# Patient Record
Sex: Female | Born: 1990 | Race: Black or African American | Hispanic: No | Marital: Single | Smoking: Never smoker
Health system: Southern US, Community
[De-identification: ages and names within clinical notes are randomized; demographics above are authoritative.]

## PROBLEM LIST (undated history)

## (undated) DIAGNOSIS — L309 Dermatitis, unspecified: Secondary | ICD-10-CM

## (undated) HISTORY — DX: Dermatitis, unspecified: L30.9

## (undated) HISTORY — PX: NO PAST SURGERIES: SHX2092

---

## 2012-11-18 ENCOUNTER — Ambulatory Visit: Payer: Self-pay | Admitting: Obstetrics & Gynecology

## 2015-04-15 NOTE — L&D Delivery Note (Signed)
Delivery Note At 04:52 am a viable female, "Elizabeth Sexton", was delivered via Vaginal, Spontaneous Delivery (Presentation:OA restituting to ROA). APGARS: 7, 9; weight pending.   Placenta status: Spontaneous, intact.  Cord: with the following complications: Velamentous, Marginal. Sent to path for analysis. Cord pH: NA  Anesthesia: Local for repair Episiotomy: NA Lacerations: Small 1st degree vaginal - one interrupted placed Suture Repair: 3-0 chromic SH Est. Blood Loss (mL): 350  Mom to postpartum.  Baby to Couplet care / Skin to Skin.  Mom plans to breastfeed.   Undecided re: birth control.   Sherre ScarletKimberly Franko Hilliker, CNM 12/02/15, 5:47 AM

## 2015-05-18 LAB — OB RESULTS CONSOLE RUBELLA ANTIBODY, IGM: Rubella: IMMUNE

## 2015-05-18 LAB — OB RESULTS CONSOLE GC/CHLAMYDIA
CHLAMYDIA, DNA PROBE: POSITIVE
Gonorrhea: NEGATIVE

## 2015-05-18 LAB — OB RESULTS CONSOLE HIV ANTIBODY (ROUTINE TESTING): HIV: NONREACTIVE

## 2015-05-18 LAB — OB RESULTS CONSOLE RPR: RPR: NONREACTIVE

## 2015-05-18 LAB — OB RESULTS CONSOLE ABO/RH: RH TYPE: POSITIVE

## 2015-05-18 LAB — OB RESULTS CONSOLE ANTIBODY SCREEN: ANTIBODY SCREEN: NEGATIVE

## 2015-05-18 LAB — OB RESULTS CONSOLE HEPATITIS B SURFACE ANTIGEN: Hepatitis B Surface Ag: NEGATIVE

## 2015-05-22 DIAGNOSIS — R8271 Bacteriuria: Secondary | ICD-10-CM | POA: Insufficient documentation

## 2015-05-28 DIAGNOSIS — L309 Dermatitis, unspecified: Secondary | ICD-10-CM | POA: Insufficient documentation

## 2015-11-12 LAB — OB RESULTS CONSOLE GBS
STREP GROUP B AG: POSITIVE
STREP GROUP B AG: POSITIVE

## 2015-11-12 LAB — OB RESULTS CONSOLE GC/CHLAMYDIA
Chlamydia: NEGATIVE
Gonorrhea: NEGATIVE

## 2015-12-02 ENCOUNTER — Encounter (HOSPITAL_COMMUNITY): Payer: Self-pay

## 2015-12-02 ENCOUNTER — Inpatient Hospital Stay (HOSPITAL_COMMUNITY)
Admission: AD | Admit: 2015-12-02 | Discharge: 2015-12-04 | DRG: 775 | Disposition: A | Discharge status: Home / Self Care | Payer: Medicaid Other | Source: Ambulatory Visit | Attending: Obstetrics and Gynecology | Admitting: Obstetrics and Gynecology

## 2015-12-02 DIAGNOSIS — Z3A39 39 weeks gestation of pregnancy: Secondary | ICD-10-CM

## 2015-12-02 DIAGNOSIS — O99824 Streptococcus B carrier state complicating childbirth: Secondary | ICD-10-CM | POA: Diagnosis present

## 2015-12-02 DIAGNOSIS — Z833 Family history of diabetes mellitus: Secondary | ICD-10-CM

## 2015-12-02 DIAGNOSIS — O43123 Velamentous insertion of umbilical cord, third trimester: Secondary | ICD-10-CM | POA: Diagnosis present

## 2015-12-02 DIAGNOSIS — Z3403 Encounter for supervision of normal first pregnancy, third trimester: Secondary | ICD-10-CM | POA: Diagnosis present

## 2015-12-02 DIAGNOSIS — O4290 Premature rupture of membranes, unspecified as to length of time between rupture and onset of labor, unspecified weeks of gestation: Secondary | ICD-10-CM | POA: Diagnosis present

## 2015-12-02 LAB — COMPREHENSIVE METABOLIC PANEL
ALBUMIN: 3.4 g/dL — AB (ref 3.5–5.0)
ALK PHOS: 121 U/L (ref 38–126)
ALT: 22 U/L (ref 14–54)
AST: 30 U/L (ref 15–41)
Anion gap: 8 (ref 5–15)
BUN: 7 mg/dL (ref 6–20)
CALCIUM: 9.2 mg/dL (ref 8.9–10.3)
CHLORIDE: 105 mmol/L (ref 101–111)
CO2: 22 mmol/L (ref 22–32)
CREATININE: 0.6 mg/dL (ref 0.44–1.00)
GFR calc non Af Amer: >60 mL/min (ref >60)
GLUCOSE: 78 mg/dL (ref 65–99)
Potassium: 4.1 mmol/L (ref 3.5–5.1)
SODIUM: 135 mmol/L (ref 135–145)
Total Bilirubin: 0.9 mg/dL (ref 0.3–1.2)
Total Protein: 7 g/dL (ref 6.5–8.1)

## 2015-12-02 LAB — CBC
HCT: 33.8 % — ABNORMAL LOW (ref 36.0–46.0)
HEMOGLOBIN: 11.8 g/dL — AB (ref 12.0–15.0)
MCH: 31.8 pg (ref 26.0–34.0)
MCHC: 34.9 g/dL (ref 30.0–36.0)
MCV: 91.1 fL (ref 78.0–100.0)
Platelets: 191 10*3/uL (ref 150–400)
RBC: 3.71 MIL/uL — ABNORMAL LOW (ref 3.87–5.11)
RDW: 13 % (ref 11.5–15.5)
WBC: 13.1 10*3/uL — ABNORMAL HIGH (ref 4.0–10.5)

## 2015-12-02 LAB — TYPE AND SCREEN
ABO/RH(D): O POS
Antibody Screen: NEGATIVE

## 2015-12-02 LAB — LACTATE DEHYDROGENASE: LDH: 183 U/L (ref 98–192)

## 2015-12-02 LAB — URIC ACID: Uric Acid, Serum: 5 mg/dL (ref 2.3–6.6)

## 2015-12-02 LAB — RPR: RPR Ser Ql: NONREACTIVE

## 2015-12-02 LAB — ABO/RH: ABO/RH(D): O POS

## 2015-12-02 MED ORDER — ACETAMINOPHEN 325 MG PO TABS
650.0000 mg | ORAL_TABLET | ORAL | Status: DC | PRN
  Administered 2015-12-02: 650 mg via ORAL
  Filled 2015-12-02: qty 2

## 2015-12-02 MED ORDER — LACTATED RINGERS IV SOLN: 500.0000 mL | Freq: Once | INTRAVENOUS | Status: DC

## 2015-12-02 MED ORDER — DEXTROSE 5 % IV SOLN
2.5000 10*6.[IU] | INTRAVENOUS | Status: DC
  Filled 2015-12-02 (×2): qty 2.5

## 2015-12-02 MED ORDER — PHENYLEPHRINE 40 MCG/ML (10ML) SYRINGE FOR IV PUSH (FOR BLOOD PRESSURE SUPPORT)
80.0000 ug | PREFILLED_SYRINGE | INTRAVENOUS | Status: DC | PRN
  Filled 2015-12-02: qty 5

## 2015-12-02 MED ORDER — FLEET ENEMA 7-19 GM/118ML RE ENEM: 1.0000 | ENEMA | RECTAL | Status: DC | PRN

## 2015-12-02 MED ORDER — OXYTOCIN 40 UNITS IN LACTATED RINGERS INFUSION - SIMPLE MED
2.5000 [IU]/h | INTRAVENOUS | Status: DC
  Filled 2015-12-02: qty 1000

## 2015-12-02 MED ORDER — PENICILLIN G POTASSIUM 5000000 UNITS IJ SOLR
5.0000 10*6.[IU] | Freq: Once | INTRAVENOUS | Status: AC
  Administered 2015-12-02: 5 10*6.[IU] via INTRAVENOUS
  Filled 2015-12-02: qty 5

## 2015-12-02 MED ORDER — METHYLERGONOVINE MALEATE 0.2 MG PO TABS: 0.2000 mg | ORAL_TABLET | ORAL | Status: DC | PRN

## 2015-12-02 MED ORDER — ONDANSETRON HCL 4 MG/2ML IJ SOLN
4.0000 mg | Freq: Four times a day (QID) | INTRAMUSCULAR | Status: DC | PRN
  Administered 2015-12-02: 4 mg via INTRAVENOUS
  Filled 2015-12-02: qty 2

## 2015-12-02 MED ORDER — OXYCODONE-ACETAMINOPHEN 5-325 MG PO TABS: 2.0000 | ORAL_TABLET | ORAL | Status: DC | PRN

## 2015-12-02 MED ORDER — SENNOSIDES-DOCUSATE SODIUM 8.6-50 MG PO TABS
2.0000 | ORAL_TABLET | ORAL | Status: DC
  Administered 2015-12-02 – 2015-12-03 (×2): 2 via ORAL
  Filled 2015-12-02 (×2): qty 2

## 2015-12-02 MED ORDER — SOD CITRATE-CITRIC ACID 500-334 MG/5ML PO SOLN: 30.0000 mL | ORAL | Status: DC | PRN

## 2015-12-02 MED ORDER — OXYCODONE-ACETAMINOPHEN 5-325 MG PO TABS: 1.0000 | ORAL_TABLET | ORAL | Status: DC | PRN

## 2015-12-02 MED ORDER — ACETAMINOPHEN 325 MG PO TABS
650.0000 mg | ORAL_TABLET | ORAL | Status: DC | PRN
Start: 2015-12-02 — End: 2015-12-02

## 2015-12-02 MED ORDER — BENZOCAINE-MENTHOL 20-0.5 % EX AERO: 1.0000 "application " | INHALATION_SPRAY | CUTANEOUS | Status: DC | PRN

## 2015-12-02 MED ORDER — WITCH HAZEL-GLYCERIN EX PADS: 1.0000 "application " | MEDICATED_PAD | CUTANEOUS | Status: DC | PRN

## 2015-12-02 MED ORDER — LACTATED RINGERS IV SOLN
INTRAVENOUS | Status: DC
  Administered 2015-12-02 (×2): via INTRAVENOUS

## 2015-12-02 MED ORDER — PRENATAL MULTIVITAMIN CH
1.0000 | ORAL_TABLET | Freq: Every day | ORAL | Status: DC
  Administered 2015-12-02 – 2015-12-04 (×3): 1 via ORAL
  Filled 2015-12-02 (×3): qty 1

## 2015-12-02 MED ORDER — FENTANYL 2.5 MCG/ML BUPIVACAINE 1/10 % EPIDURAL INFUSION (WH - ANES): 14.0000 mL/h | INTRAMUSCULAR | Status: DC | PRN

## 2015-12-02 MED ORDER — OXYTOCIN BOLUS FROM INFUSION
500.0000 mL | Freq: Once | INTRAVENOUS | Status: AC
  Administered 2015-12-02: 500 mL via INTRAVENOUS

## 2015-12-02 MED ORDER — COCONUT OIL OIL: 1.0000 "application " | TOPICAL_OIL | Status: DC | PRN

## 2015-12-02 MED ORDER — FERROUS SULFATE 325 (65 FE) MG PO TABS
325.0000 mg | ORAL_TABLET | Freq: Two times a day (BID) | ORAL | Status: DC
  Administered 2015-12-02 – 2015-12-04 (×5): 325 mg via ORAL
  Filled 2015-12-02 (×5): qty 1

## 2015-12-02 MED ORDER — LIDOCAINE HCL (PF) 1 % IJ SOLN
30.0000 mL | INTRAMUSCULAR | Status: AC | PRN
  Administered 2015-12-02: 30 mL via SUBCUTANEOUS
  Filled 2015-12-02: qty 30

## 2015-12-02 MED ORDER — SIMETHICONE 80 MG PO CHEW
80.0000 mg | CHEWABLE_TABLET | ORAL | Status: DC | PRN
Start: 2015-12-02 — End: 2015-12-04

## 2015-12-02 MED ORDER — IBUPROFEN 600 MG PO TABS
600.0000 mg | ORAL_TABLET | Freq: Four times a day (QID) | ORAL | Status: DC
  Administered 2015-12-02 – 2015-12-04 (×9): 600 mg via ORAL
  Filled 2015-12-02 (×9): qty 1

## 2015-12-02 MED ORDER — METHYLERGONOVINE MALEATE 0.2 MG/ML IJ SOLN: 0.2000 mg | INTRAMUSCULAR | Status: DC | PRN

## 2015-12-02 MED ORDER — ONDANSETRON HCL 4 MG/2ML IJ SOLN: 4.0000 mg | INTRAMUSCULAR | Status: DC | PRN

## 2015-12-02 MED ORDER — ZOLPIDEM TARTRATE 5 MG PO TABS: 5.0000 mg | ORAL_TABLET | Freq: Every evening | ORAL | Status: DC | PRN

## 2015-12-02 MED ORDER — ONDANSETRON HCL 4 MG PO TABS: 4.0000 mg | ORAL_TABLET | ORAL | Status: DC | PRN

## 2015-12-02 MED ORDER — DIBUCAINE 1 % RE OINT: 1.0000 "application " | TOPICAL_OINTMENT | RECTAL | Status: DC | PRN

## 2015-12-02 MED ORDER — DIPHENHYDRAMINE HCL 50 MG/ML IJ SOLN: 12.5000 mg | INTRAMUSCULAR | Status: DC | PRN

## 2015-12-02 MED ORDER — LACTATED RINGERS IV SOLN: 500.0000 mL | INTRAVENOUS | Status: DC | PRN

## 2015-12-02 MED ORDER — DIPHENHYDRAMINE HCL 25 MG PO CAPS: 25.0000 mg | ORAL_CAPSULE | Freq: Four times a day (QID) | ORAL | Status: DC | PRN

## 2015-12-02 MED ORDER — TETANUS-DIPHTH-ACELL PERTUSSIS 5-2.5-18.5 LF-MCG/0.5 IM SUSP: 0.5000 mL | Freq: Once | INTRAMUSCULAR | Status: DC

## 2015-12-02 MED ORDER — EPHEDRINE 5 MG/ML INJ
10.0000 mg | INTRAVENOUS | Status: DC | PRN
  Filled 2015-12-02: qty 4

## 2015-12-02 MED ORDER — NALBUPHINE HCL 10 MG/ML IJ SOLN: 10.0000 mg | INTRAMUSCULAR | Status: DC | PRN

## 2015-12-02 MED ORDER — HYDROXYZINE HCL 50 MG PO TABS
50.0000 mg | ORAL_TABLET | Freq: Four times a day (QID) | ORAL | Status: DC | PRN
  Filled 2015-12-02: qty 1

## 2015-12-02 NOTE — MAU Note (Signed)
Pt c/o contractions since 7-8pm every . Denies LOF or vag bleeding. +FM. Was 2cm at last exam.

## 2015-12-02 NOTE — H&P (Signed)
Elizabeth Sexton is a 25 y.o. female, G1P0 at 39.0 wks (as dated by 18.3 wk us), presents w/ c/o ctxs q 6 min since 7-8 pm last evening. Reports ROM (mec stained at 02:00) after vaginal exam by RN. Denies VB. +FM. Was 2cm at last exam. Wanted WB, but not a candidate due to meconium stained fluid.  Pt entered care with [facility] at 12.2 wks.  Prenatal course significant for:  1) Chlamydia this pregnancy - Treated; TOC neg. 2) GBS positive. 3) Short cervical length - was using daily vaginal progesterone and getting weekly cervical lengths. 4) Velamentous & marginal cord insertion - was getting serial ultrasounds starting at 28 wks. 5) H/O eczema.   Ultrasounds: Normal scans. Growth on 11/12/15 @ 36.1 wks = 5 lbs 14 oz; 2653 g (+/-387 g); anterior placenta.  OB History    Gravida Para Term Preterm AB Living   1         0   SAB TAB Ectopic Multiple Live Births                 Past Medical History:  Diagnosis Date  . Medical history non-contributory    Past Surgical History:  Procedure Laterality Date  . NO PAST SURGERIES     Family History: family history includes Diabetes in her maternal grandmother. Social History:  reports that she has never smoked. She has never used smokeless tobacco. She reports that she does not drink alcohol or use drugs.     Maternal Diabetes: No Genetic Screening: Normal Maternal Ultrasounds/Referrals: Normal Fetal Ultrasounds or other Referrals:  None Maternal Substance Abuse:  No Significant Maternal Medications:  PNV, Prometrium, Hydrocortisone cream Significant Maternal Lab Results:  Lab values include: Group B Strep positive Other Comments:  Declined Tdap  ROS 10 Systems reviewed and are negative for acute change except as noted in the HPI. History Exam  Dilation: 3 Effacement (%): 100 Station: -1 Exam by:: [email or web address],rnc at 02:05 Cephalic by Leopold's EFW: 7 1/4 lbs; adequate for trial of labor  Physical Exam   Nursing note and vitals  reviewed. Neurological: She has normal reflexes.  Constitutional: She is oriented to person, place, and time. She appears well-developed and well-nourished.  HENT:  Head: Normocephalic and atraumatic.  Neck: Normal range of motion.  Cardiovascular: Normal rate, regular rhythm and normal heart sounds.  Respiratory: Effort normal and breath sounds normal.  GI: Soft. Bowel sounds are normal. Abdomen is gravid.  Neurological: She is alert and oriented to person, place, and time.  Skin: Skin is warm and dry.  Musculoskeletal: Exhibits 1+ lower extremity edema. Psychiatric: She has a normal mood and affect. Her behavior is normal.   Results for orders placed or performed during the hospital encounter of 12/02/15 (from the past 24 hour(s))  CBC     Status: Abnormal   Collection Time: 12/02/15  2:30 AM  Result Value Ref Range   WBC 13.1 (H) 4.0 - 10.5 K/uL   RBC 3.71 (L) 3.87 - 5.11 MIL/uL   Hemoglobin 11.8 (L) 12.0 - 15.0 g/dL   HCT 40.933.8 (L) 81.136.0 - 91.446.0 %   MCV 91.1 78.0 - 100.0 fL   MCH 31.8 26.0 - 34.0 pg   MCHC 34.9 30.0 - 36.0 g/dL   RDW 78.213.0 95.611.5 - 21.315.5 %   Platelets 191 150 - 400 K/uL  Type and screen [facility]     Status: None   Collection Time: 12/02/15  2:30 AM  Result Value Ref  Range   ABO/RH(D) O POS    Antibody Screen NEG    Sample Expiration 12/05/2015   Comprehensive metabolic panel     Status: Abnormal   Collection Time: 12/02/15  2:30 AM  Result Value Ref Range   Sodium 135 135 - 145 mmol/L   Potassium 4.1 3.5 - 5.1 mmol/L   Chloride 105 101 - 111 mmol/L   CO2 22 22 - 32 mmol/L   Glucose, Bld 78 65 - 99 mg/dL   BUN 7 6 - 20 mg/dL   Creatinine, Ser 0.860.60 0.44 - 1.00 mg/dL   Calcium 9.2 8.9 - 57.810.3 mg/dL   Total Protein 7.0 6.5 - 8.1 g/dL   Albumin 3.4 (L) 3.5 - 5.0 g/dL   AST 30 15 - 41 U/L   ALT 22 14 - 54 U/L   Alkaline Phosphatase 121 38 - 126 U/L   Total Bilirubin 0.9 0.3 - 1.2 mg/dL   GFR calc non Af Amer >60 >60 mL/min   GFR calc  Af Amer >60 >60 mL/min   Anion gap 8 5 - 15  Lactate dehydrogenase     Status: None   Collection Time: 12/02/15  2:30 AM  Result Value Ref Range   LDH 183 98 - 192 U/L  Uric acid     Status: None   Collection Time: 12/02/15  2:30 AM  Result Value Ref Range   Uric Acid, Serum 5.0 2.3 - 6.6 mg/dL  ABO/Rh     Status: None   Collection Time: 12/02/15  2:30 AM  Result Value Ref Range   ABO/RH(D) O POS     Prenatal labs: ABO, Rh: O/Positive/-- (02/03 0000) Antibody: Negative (02/03 0000) Rubella: Immune (02/03 0000) RPR: Nonreactive (02/03 0000)  HBsAg: Negative (02/03 0000)  HIV: Non-reactive (02/03 0000)  GBS: Positive, Positive (07/31 0000)  Hbg: 10.7 @ 28 wks  Assessment: IUP at 39.0 wks Latent phase labor SROM, mod mec x 1 1/2 hrs; no s/s of infection; not a WB candidate FWB: Overall reassuring GBS positive  [facility] Routine [facility] orders.  Expectant management for now. PCN for GBS positive status. Pain med/epidural prn. Expect SVD.   Sherre ScarletWILLIAMS, Draxton Luu 12/02/2015, 2:13 AM

## 2015-12-02 NOTE — Lactation Note (Addendum)
This note was copied from a baby's chart. Lactation Consultation Note  Patient Name: Elizabeth Sexton ZOXWR'UToday's Date: 12/02/2015 Reason for consult: Initial assessment;Other (Comment);Infant < 6lbs (term , < 6 pounds, )  Baby is 9 hours old and has been to the breast x 4 for 15,10, 25, 10 mins. - Latch sore 8-9 .  1 void, 1 stool. Per mom last fed at 1340 for 10 mins. Presently baby in crib asleep.  LC reviewed potential feeding patterns of a term , < 6 pound infant , importance of skin to skin feedings.  And to offer the breast at least every 3 hours .  [facility] showed me hand expressing.  LC encouraged mom to call on the nurses light for a feeding assessment.  Mother informed of post-discharge support and given phone number to the lactation department, including services  for phone call assistance; out-patient appointments; and breastfeeding support group. List of other breastfeeding resources  in the community given in the handout. Encouraged mother to call for problems or concerns related to breastfeeding. LC discussed due to the baby being small sometimes ealy breast feeding post pumping both breast with a DEBP is added if needed if the baby becomes sluggish with feedings and hand expressing.    Maternal Data Has patient been taught Hand Expression?: Yes (per mom [facility] showed mom earlier ) Does the patient have breastfeeding experience prior to this delivery?: No  Feeding Feeding Type:  (per mom baby last fed at 1340 for 10 mins ) Length of feed: 10 min (per mom )  LATCH Score/Interventions ( earlier latch score by [facility] RN )  Latch: Grasps breast easily, tongue down, lips flanged, rhythmical sucking.  Audible Swallowing: Spontaneous and intermittent  Type of Nipple: Everted at rest and after stimulation  Comfort (Breast/Nipple): Soft / non-tender     Hold (Positioning): Assistance needed to correctly position infant at breast and maintain latch. Intervention(s):  Breastfeeding basics reviewed  LATCH Score: 9  Lactation Tools Discussed/Used WIC Program: Yes (per mom [place] )   Consult Status Consult Status: Follow-up Date: 12/02/15 Follow-up type: In-patient    Kathrin Greathouseorio, Emad Brechtel Ann 12/02/2015, 2:45 PM

## 2015-12-03 LAB — CBC
HCT: 29.9 % — ABNORMAL LOW (ref 36.0–46.0)
HEMOGLOBIN: 10 g/dL — AB (ref 12.0–15.0)
MCH: 30.9 pg (ref 26.0–34.0)
MCHC: 33.4 g/dL (ref 30.0–36.0)
MCV: 92.3 fL (ref 78.0–100.0)
PLATELETS: 187 10*3/uL (ref 150–400)
RBC: 3.24 MIL/uL — AB (ref 3.87–5.11)
RDW: 13 % (ref 11.5–15.5)
WBC: 13 10*3/uL — AB (ref 4.0–10.5)

## 2015-12-03 NOTE — Discharge Summary (Signed)
[facility]   Patient Name:   Elizabeth Sexton DOB:     02/23/1991 MRN:     [id number]  Date of Admission:   12/02/2015 Date of Discharge:  12/04/2015  Admitting diagnosis:    in labor, contractions, seeing mucus Principal Problem:   Vaginal delivery Active Problems:   First degree perineal laceration during delivery      Discharge diagnosis:    in labor, contractions, seeing mucus Principal Problem:   Vaginal delivery Active Problems:   First degree perineal laceration during delivery                                                                  Post partum procedures: None  Type of Delivery:  SVB  Delivering Provider: Sherre ScarletWILLIAMS, KIMBERLY   Date of Delivery:  12/02/15  Newborn Data:    Live born female  Birth Weight: 5 lb 12.4 oz (2620 g) APGAR: 7, 9  Baby's Name:  Elizabeth Sexton Baby Feeding:   Breast Disposition:   home with mother  Complications:   None  Hospital course:      Onset of Labor With Vaginal Delivery     25 y.o. yo G1P1001 at 5059w0d was admitted in Latent Labor on 12/02/2015. Patient had an uncomplicated labor course as follows:  Membrane Rupture Time/Date: 2:00 AM ,12/02/2015   Intrapartum Procedures: Episiotomy: None [1]                                         Lacerations:  1st degree [2];Vaginal [6]  Patient had a delivery of a Viable infant. 12/02/2015  Information for the patient's newborn:  Elizabeth Sexton, Girl Elizabeth Sexton [[id number]][[id number]]  Delivery Method: Vaginal, Spontaneous Delivery (Filed from Delivery Summary)    Pateint had an uncomplicated postpartum course.  She is ambulating, tolerating a regular diet, passing flatus, and urinating well. Patient is discharged home in stable condition on 12/04/15.    Physical Exam: Vitals:   12/03/15 1758 12/04/15 0555  BP: 124/62 130/67  Pulse: 82 86  Resp: 18 18  Temp: 98.3 F (36.8 C) 98.2 F (36.8 C)    General: alert Lochia: appropriate Uterine Fundus: firm Incision:  Healing well with no significant drainage DVT Evaluation: No evidence of DVT seen on physical exam. Negative Homan's sign.  Labs:  CBC Latest Ref Rng & Units 12/03/2015 12/02/2015  WBC 4.0 - 10.5 K/uL 13.0(H) 13.1(H)  Hemoglobin 12.0 - 15.0 g/dL 10.0(L) 11.8(L)  Hematocrit 36.0 - 46.0 % 29.9(L) 33.8(L)  Platelets 150 - 400 K/uL 187 191     Discharge instruction: per After Visit Summary and "Baby and Me Booklet".  After Visit Meds:    Medication List    TAKE these medications   ibuprofen 600 MG tablet Commonly known as:  ADVIL,MOTRIN Take 1 tablet (600 mg total) by mouth every 6 (six) hours as needed.   OVER THE COUNTER MEDICATION Take 1 tablet by mouth daily.   VITAFOL GUMMIES 3.33-0.333-34.8 MG Chew CHEW 3 GUMMIES PO QD AS DIRECTED       Diet: routine diet  Activity: Advance as tolerated. Pelvic rest for 6 weeks.   Outpatient follow up:6 weeks  Follow up Appt:No future appointments. Follow up visit: No Follow-up on file.  Postpartum contraception: Undecided  12/04/2015 Nigel BridgemanLATHAM, Decarlos Empey, CNM

## 2015-12-03 NOTE — Lactation Note (Signed)
This note was copied from a baby's chart. Lactation Consultation Note  Patient Name: Elizabeth Sexton Reason for consult: Follow-up assessment;Infant < 6lbs;Other (Comment) (< 6 pounds, 4% weight loss, term ) Baby is 33 hours of age and has been consistent at the breast . @ the start of the consult baby latched , mom independent  And per mom 2nd breast at this feeding , depth noted, swallows, increased with breast compressions.  After 15 mins baby sleepy and non - nutritive so mom released latch. Nipple normal shape.  Baby still rooting and LC offered to re-latch on the other breast and per mom she already fed over there.  LC reviewed what cluster feeding looks like and that it can be different for all babies , different times a day , evening or night. Since the weight loss is only 4 % weight loss , LC felt mom could keep it simple , feed by 3 hours around the clock and could add post pumping  With a DEBP if the weight loss increases. LC explained reasons for doing that.    Maternal Data    Feeding Feeding Type:  (baby already latched with depth , swallows noted ) Length of feed: 15 min (per mom start time,  swallows , increased w/ breast compresi)  LATCH Score/Interventions Latch: Grasps breast easily, tongue down, lips flanged, rhythmical sucking. Intervention(s): Breast compression;Breast massage  Audible Swallowing: A few with stimulation  Type of Nipple: Everted at rest and after stimulation  Comfort (Breast/Nipple): Soft / non-tender     Hold (Positioning): No assistance needed to correctly position infant at breast. Intervention(s): Breastfeeding basics reviewed;Support Pillows;Position options;Skin to skin  LATCH Score: 9  Lactation Tools Discussed/Used     Consult Status Consult Status: Follow-up Date: 12/04/15 Follow-up type: In-patient    Kathrin Greathouseorio, Edmar Blankenburg [name], 2:31 PM

## 2015-12-03 NOTE — Progress Notes (Signed)
Elizabeth Sexton, Elizabeth Sexton Female, 25 y.o., Oct 30, 1990  Post Partum Day 1   Subjective: no complaints  Objective: Blood pressure 130/77, pulse 68, temperature 97.9 F (36.6 C), resp. rate 19, height 5\' 7"  (1.702 m), weight 81.2 kg (179 lb), SpO2 100 %, unknown if currently breastfeeding.  Physical Exam:  General: alert, cooperative and no distress Lochia: appropriate Uterine Fundus: firm DVT Evaluation: No evidence of DVT seen on physical exam. Calf/Ankle edema is present. 1+ edema bilaterally in legs.     Recent Labs  12/02/15 0230 12/03/15 0557  HGB 11.8* 10.0*  HCT 33.8* 29.9*    Assessment/Plan: Plan for discharge tomorrow, Breastfeeding and Contraception is undecided, all options discussed, she will think more about it.    LOS: 1 day   [facility] Hospital[facility],Elizabeth Sexton 12/03/2015, 1:49 PM

## 2015-12-03 NOTE — Progress Notes (Signed)
UR chart review completed.  

## 2015-12-03 NOTE — Discharge Instructions (Signed)

## 2015-12-04 MED ORDER — IBUPROFEN 600 MG PO TABS: 600.0000 mg | ORAL_TABLET | Freq: Four times a day (QID) | ORAL | 2 refills | Status: DC | PRN

## 2015-12-04 NOTE — Lactation Note (Signed)
This note was copied from a baby's chart. Lactation Consultation Note  Patient Name: Elizabeth Micki RileyKiera Besancon JYNWG'NToday's Date: 12/04/2015 Reason for consult: Follow-up assessment;Infant < 6lbs Follow up visit made prior to discharge.  Mom is feeling like feedings are going well.  Breasts are filling.  Instructed to use good breast massage during feeding,  Feed on cue and keep a feeding diary the first week home.  Engorgement treatment instructions given.  Lactation outpatient services and support information reviewed and encouraged prn.   Maternal Data    Feeding    LATCH Score/Interventions                      Lactation Tools Discussed/Used     Consult Status Consult Status: Complete    Shondell Poulson S 12/04/2015, 10:00 AM

## 2016-12-11 ENCOUNTER — Ambulatory Visit (INDEPENDENT_AMBULATORY_CARE_PROVIDER_SITE_OTHER): Payer: 59 | Admitting: Allergy

## 2016-12-11 ENCOUNTER — Encounter: Payer: Self-pay | Admitting: Allergy

## 2016-12-11 VITALS — BP 116/78 | HR 100 | Temp 98.6°F | Resp 18 | Ht 67.3 in | Wt 142.0 lb

## 2016-12-11 DIAGNOSIS — L309 Dermatitis, unspecified: Secondary | ICD-10-CM | POA: Diagnosis not present

## 2016-12-11 MED ORDER — CLOBETASOL PROPIONATE 0.05 % EX OINT: TOPICAL_OINTMENT | CUTANEOUS | 5 refills | Status: DC

## 2016-12-11 NOTE — Patient Instructions (Signed)
Hand Dermatitis     - appearance of rash looks consistent with eczema of the hands.  There also may be a contact dermatitis component that can be ruled out with patch testing.  Recommend patch test placement on a Monday or Tuesday (this can be done in [place] office).  You would return 2 days after placement in for initial reading and again 2 days later for final reading.       - will prescribe clobetasol to apply twice a day to affected areas.      - apply your moisturizer on top-- best moisturizers are thick and greasy like Vaseline, Aquafor, Eucerin, CeraVe, Aveeno eczema.       - recommend sleeping in cotton gloves to help maintain moisture to the hands.       - Dupixent discussed today as potential treatment option if patch testing is negative and doesn't reveal a contact dermatitis.     Follow-up for patch test placement and then routinely in 4-6 months

## 2016-12-11 NOTE — Progress Notes (Signed)
New Patient Note  RE: Elizabeth Sexton MRN: [id number] DOB: 10/19/1990 Date of Office Visit: 12/11/2016  Referring provider: No ref. provider found Primary care provider: Patient, No Pcp Per  Chief Complaint: Rash on hands  History of present illness: Elizabeth Sexton is a 26 y.o. female presenting today for evaluation of hand dermatitis.  She has had a rash on her hands since March.  She does have a history of eczema but reports she hadn't had eczema in years until she became pregnant.  After delivery about a year ago it resolved. She states that this rash looks somewhat different than her typical eczema flares.  The rash in her hands is very itchy and she reports appearance today is decent. She has had issues across her knuckles of both hands and the rash does extend onto the palmar surface and proximally onto her wrists.  She denies the rash and blistering or sloughing and has not noticed any significant scaling or flaking. The rash has been very dry and rough.  She works from home and is not involved in any chemicals or other harsh products. She stopped wearing jewelry.  She does not know what triggered this rash.  She denies having any fevers or secondary infections.  She has tried hydrocortisone, aloe, lanolin, breast milk, A&D ointment, lotions all that have not been very helpful.   She lotions with Aveeno.  Rash is itchy, burns, and is dry.   She currently is breast-feeding.    She denies any history of asthma, food allergy or environmental allergy  Review of systems: Review of Systems  Constitutional: Negative for chills, fever and malaise/fatigue.  HENT: Negative for congestion, ear discharge, ear pain, nosebleeds, sinus pain, sore throat and tinnitus.   Eyes: Negative for discharge and redness.  Respiratory: Negative for cough, shortness of breath and wheezing.   Cardiovascular: Negative for chest pain.  Gastrointestinal: Negative for abdominal pain, constipation, diarrhea,  heartburn, nausea and vomiting.  Musculoskeletal: Negative for joint pain and myalgias.  Skin: Positive for itching and rash.  Neurological: Negative for headaches.    All other systems negative unless noted above in HPI  Past medical history: Past Medical History:  Diagnosis Date  . Eczema     Past surgical history: Past Surgical History:  Procedure Laterality Date  . NO PAST SURGERIES      Family history:  Family History  Problem Relation Age of Onset  . Diabetes Maternal Grandmother     Social history: She lives in a home without carpeting with gas and electric heating and central cooling. There are no pets in the home. There is no concern for water damage, mildew or roaches in the home. She works from home. She has no smoking history.   Medication List: Allergies as of 12/11/2016   No Known Allergies     Medication List       Accurate as of 12/11/16  1:34 PM. Always use your most recent med list.          clobetasol ointment 0.05 % Commonly known as:  TEMOVATE Apply to affected areas twice daily as directed   VITAFOL GUMMIES 3.33-0.333-34.8 MG Chew CHEW 3 GUMMIES PO QD AS DIRECTED            Discharge Care Instructions        Start     Ordered   12/11/16 0000  clobetasol ointment (TEMOVATE) 0.05 %     12/11/16 1151      Known medication  allergies: No Known Allergies   Physical examination: Blood pressure 116/78, pulse 100, temperature 98.6 F (37 C), temperature source Oral, resp. rate 18, height 5' 7.3" (1.709 m), weight 142 lb (64.4 kg), unknown if currently breastfeeding.  General: Alert, interactive, in no acute distress. HEENT: PERRLA, TMs pearly gray, turbinates non-edematous without discharge, post-pharynx non erythematous. Neck: Supple without lymphadenopathy. Lungs: Clear to auscultation without wheezing, rhonchi or rales. {no increased work of breathing. CV: Normal S1, S2 without murmurs. Abdomen: Nondistended,  nontender. Skin: Dry, erythematous, excoriated patches on the Hands across knuckles on the ventral surface extending onto the palmar surface involving the fingers.  There is also some mild hyperpigmentation.  Rash does extend to the wrists. Extremities:  No clubbing, cyanosis or edema. Neuro:   Grossly intact.  Diagnositics/Labs: None today  Assessment and plan:   Hand Dermatitis     - appearance of rash looks consistent with Atopic dermatitis.  However this also could be related to a contact dermatitis that can be ruled out with patch testing.  Recommend patch test placement on a Monday or Tuesday (this can be done in [place] office).  You would return 2 days after placement in for initial reading and again 2 days later for final reading.       - will prescribe clobetasol to apply twice a day to affected areas.      - apply your moisturizer on top-- best moisturizers are thick and greasy like Vaseline, Aquafor, Eucerin, CeraVe, Aveeno eczema.       - recommend sleeping in cotton gloves to help maintain moisture to the hands.       - Dupixent discussed today as potential treatment option if patch testing is negative and doesn't reveal a contact dermatitis.     Follow-up for patch test placement and then routinely in 4-6 months  I appreciate the opportunity to take part in Marika's care. Please do not hesitate to contact me with questions.  Sincerely,   Margo Aye, MD Allergy/Immunology [facility]

## 2017-01-06 ENCOUNTER — Encounter: Payer: Self-pay | Admitting: Allergy and Immunology

## 2017-01-06 ENCOUNTER — Ambulatory Visit (INDEPENDENT_AMBULATORY_CARE_PROVIDER_SITE_OTHER): Payer: 59 | Admitting: Allergy and Immunology

## 2017-01-06 VITALS — BP 114/70 | HR 97 | Temp 98.4°F | Resp 18

## 2017-01-06 DIAGNOSIS — L309 Dermatitis, unspecified: Secondary | ICD-10-CM

## 2017-01-06 NOTE — Progress Notes (Signed)
Elizabeth Sexton presents to this clinic to have patch testing performed. She had the True Test patch test applied to her back and she will return to this clinic in 48-72 hours for initial read.

## 2017-01-08 ENCOUNTER — Encounter: Payer: Self-pay | Admitting: Allergy

## 2017-01-08 ENCOUNTER — Ambulatory Visit: Payer: 59 | Admitting: Allergy

## 2017-01-08 DIAGNOSIS — L309 Dermatitis, unspecified: Secondary | ICD-10-CM

## 2017-01-08 NOTE — Progress Notes (Addendum)
  Elizabeth Sexton returns to the office today for the final patch test interpretation, given suspected history of contact dermatitis.    Diagnostics:   TRUE TEST 48-hour hour reading: weak positive reaction to #4 (Potassium dichromate), weak positive  reaction to #17 (Cl+Me-Isothiazolinone), strong positive reaction to #23 (Thiomersal), weak positive reaction to #28 (Gold sodium thiosulfate) and weak positive reaction to #33 (Bacitracin)  Plan:   Allergic contact dermatitis - The patient has been provided detailed information regarding the substances she is sensitive to, as well as products containing the substances.   - Meticulous avoidance of these substances is recommended.  - If avoidance is not possible, the use of barrier creams or lotions is recommended. - If symptoms persist or progress despite meticulous avoidance of the positive substances, Dermatology Referral may be warranted.  Attestation: I interpreted patch testing results along with NP Brynnly Bonet and discussed management with the resident. I reviewed the NP's note and agree with the documented findings and plan of care.   Margo Aye, MD [facility]

## 2017-01-12 ENCOUNTER — Encounter (INDEPENDENT_AMBULATORY_CARE_PROVIDER_SITE_OTHER): Payer: Self-pay

## 2017-01-12 ENCOUNTER — Ambulatory Visit: Payer: 59 | Admitting: Allergy and Immunology

## 2017-01-12 DIAGNOSIS — L253 Unspecified contact dermatitis due to other chemical products: Secondary | ICD-10-CM | POA: Insufficient documentation

## 2017-01-12 NOTE — Addendum Note (Signed)
Addended by: Clifton James on: 01/12/2017 04:59 PM   Modules accepted: Orders

## 2017-01-12 NOTE — Progress Notes (Signed)
    Follow-up Note  RE: Elizabeth Sexton MRN: [id number] DOB: November 15, 1990 Date of Office Visit: 01/12/2017  Primary care provider: Patient, No Pcp Per Referring provider: No ref. provider found   [name] returns to the office today for the final patch test interpretation, given suspected history of contact dermatitis.    Diagnostics:  TRUE TEST final reading: Positive to thimerosal.  Assessment and Plan:   Allergic contact dermatitis  The patient has been provided detailed information regarding thimerosal, as well as products containing this substance.  Meticulous avoidance of thimerosal is recommended. If avoidance is not possible, the use of barrier creams or lotions is recommended.  Follow up with Dr. Delorse Lek as directed.

## 2017-01-12 NOTE — Patient Instructions (Signed)
Contact dermatitis due to chemicals  The patient has been provided detailed information regarding thimerosal, as well as products containing this substance.  Meticulous avoidance of thimerosal is recommended. If avoidance is not possible, the use of barrier creams or lotions is recommended.  Follow up with Dr. Delorse Lek as directed.   Return if symptoms worsen or fail to improve.

## 2017-01-12 NOTE — Assessment & Plan Note (Signed)
   The patient has been provided detailed information regarding thimerosal, as well as products containing this substance.  Meticulous avoidance of thimerosal is recommended. If avoidance is not possible, the use of barrier creams or lotions is recommended.  Follow up with Dr. Delorse Lek as directed.

## 2017-01-19 ENCOUNTER — Ambulatory Visit: Payer: PRIVATE HEALTH INSURANCE | Admitting: Allergy and Immunology

## 2019-08-04 ENCOUNTER — Ambulatory Visit: Payer: Self-pay | Attending: Internal Medicine

## 2019-08-04 DIAGNOSIS — Z23 Encounter for immunization: Secondary | ICD-10-CM

## 2019-08-04 NOTE — Progress Notes (Signed)
   Covid-19 Vaccination Clinic  Name:  Elizabeth Sexton    MRN: [id number] DOB: 14-Dec-1990  08/04/2019  [name] was observed post Covid-19 immunization for 15 minutes without incident. She was provided with Vaccine Information Sheet and instruction to access the V-Safe system.   [name] was instructed to call 911 with any severe reactions post vaccine: Marland Kitchen Difficulty breathing  . Swelling of face and throat  . A fast heartbeat  . A bad rash all over body  . Dizziness and weakness   Immunizations Administered    Name Date Dose VIS Date Route   Pfizer COVID-19 Vaccine 08/04/2019  2:58 PM 0.3 mL 06/08/2018 Intramuscular   Manufacturer: [employer], [employer]   Lot: [id number]   NDC: [address]-3

## 2019-08-29 ENCOUNTER — Ambulatory Visit: Payer: Self-pay

## 2019-09-01 ENCOUNTER — Ambulatory Visit: Payer: Medicaid Other | Attending: Internal Medicine

## 2019-09-01 DIAGNOSIS — Z23 Encounter for immunization: Secondary | ICD-10-CM

## 2019-09-01 NOTE — Progress Notes (Signed)
   Covid-19 Vaccination Clinic  Name:  Ayeisha Lindenberger    MRN: [id number] DOB: 1990-10-29  09/01/2019  Ms. Molstad was observed post Covid-19 immunization for 15 minutes without incident. She was provided with Vaccine Information Sheet and instruction to access the V-Safe system.   Ms. Schewe was instructed to call 911 with any severe reactions post vaccine: Marland Kitchen Difficulty breathing  . Swelling of face and throat  . A fast heartbeat  . A bad rash all over body  . Dizziness and weakness   Immunizations Administered    Name Date Dose VIS Date Route   Pfizer COVID-19 Vaccine 09/01/2019 12:19 PM 0.3 mL 06/08/2018 Intramuscular   Manufacturer: [employer], [employer]   Lot: [id number]   NDC: [address]-9

## 2019-10-21 ENCOUNTER — Other Ambulatory Visit: Payer: Self-pay | Admitting: Obstetrics and Gynecology

## 2019-10-21 DIAGNOSIS — N6320 Unspecified lump in the left breast, unspecified quadrant: Secondary | ICD-10-CM

## 2019-11-02 ENCOUNTER — Ambulatory Visit
Admission: RE | Admit: 2019-11-02 | Discharge: 2019-11-02 | Disposition: A | Discharge status: Home / Self Care | Payer: Self-pay | Source: Ambulatory Visit | Attending: Obstetrics and Gynecology | Admitting: Obstetrics and Gynecology

## 2019-11-02 ENCOUNTER — Other Ambulatory Visit: Payer: Self-pay

## 2019-11-02 DIAGNOSIS — N6320 Unspecified lump in the left breast, unspecified quadrant: Secondary | ICD-10-CM

## 2020-04-20 ENCOUNTER — Other Ambulatory Visit: Payer: Medicaid Other

## 2020-04-20 DIAGNOSIS — Z20822 Contact with and (suspected) exposure to covid-19: Secondary | ICD-10-CM

## 2020-04-24 LAB — NOVEL CORONAVIRUS, NAA: SARS-CoV-2, NAA: NOT DETECTED

## 2021-03-11 ENCOUNTER — Encounter (HOSPITAL_COMMUNITY): Payer: Self-pay | Admitting: Emergency Medicine

## 2021-03-11 ENCOUNTER — Other Ambulatory Visit: Payer: Self-pay

## 2021-03-11 ENCOUNTER — Ambulatory Visit (HOSPITAL_COMMUNITY)
Admission: EM | Admit: 2021-03-11 | Discharge: 2021-03-11 | Disposition: A | Discharge status: Home / Self Care | Payer: Medicaid Other | Attending: Physician Assistant | Admitting: Physician Assistant

## 2021-03-11 DIAGNOSIS — J40 Bronchitis, not specified as acute or chronic: Secondary | ICD-10-CM

## 2021-03-11 DIAGNOSIS — J329 Chronic sinusitis, unspecified: Secondary | ICD-10-CM

## 2021-03-11 DIAGNOSIS — H66002 Acute suppurative otitis media without spontaneous rupture of ear drum, left ear: Secondary | ICD-10-CM

## 2021-03-11 DIAGNOSIS — R051 Acute cough: Secondary | ICD-10-CM

## 2021-03-11 LAB — POC URINE PREG, ED: Preg Test, Ur: NEGATIVE

## 2021-03-11 MED ORDER — AMOXICILLIN-POT CLAVULANATE 875-125 MG PO TABS: 1.0000 | ORAL_TABLET | Freq: Two times a day (BID) | ORAL | 0 refills | Status: AC

## 2021-03-11 NOTE — Discharge Instructions (Signed)
Urine pregnancy test was negative.  We are going to treat for a sinus/ear infection.  Please take Augmentin twice daily.  Use Mucinex, Flonase, Tylenol for additional symptom relief.  Make sure you are drinking plenty of fluid.  Follow-up with your PCP next week to ensure symptom improvement.  If you have any severe symptoms including high fever, increased pain, nausea, vomiting you need to be reevaluated.

## 2021-03-11 NOTE — ED Triage Notes (Signed)
Pt reports for a week having sinus issues, bilat ear, throat, congestion and cough. Taking OTC meds and home remedies for ears without relief.

## 2021-03-11 NOTE — ED Provider Notes (Signed)
[facility]    CSN: [id number] Arrival date & time: 03/11/21  1106      History   Chief Complaint Chief Complaint  Patient presents with   Sore Throat   Nasal Congestion   Cough    HPI Elizabeth Sexton is a 30 y.o. female.   Patient presents today with a week plus long history of sinus symptoms.  Reports sinus congestion, sore throat, otalgia, cough, fatigue, malaise.  Denies fever, chest pain, shortness of breath, body aches, headache.  She has tried numerous over-the-counter medications including NyQuil and Tylenol without improvement of symptoms.  Denies any significant past medical history including asthma, allergies, smoking.  Reports he has a daughter who is in school and could have exposure to an illness but denies any known sick contacts.  Denies any recent antibiotic use.  She does not believe that she is pregnant but is interested in testing particularly as she has had some right-sided breast tenderness; LMP 02/15/2021.  She is up-to-date on COVID-19 and influenza vaccinations.  She is having difficulty with daily activities as result of symptoms.   Past Medical History:  Diagnosis Date   Eczema     Patient Active Problem List   Diagnosis Date Noted   Contact dermatitis due to chemicals 01/12/2017   Eczema 05/28/2015   Bacteriuria 05/22/2015    Past Surgical History:  Procedure Laterality Date   NO PAST SURGERIES      OB History     Gravida  1   Para  1   Term  1   Preterm      AB      Living  1      SAB      IAB      Ectopic      Multiple  0   Live Births  1            Home Medications    Prior to Admission medications   Medication Sig Start Date End Date Taking? Authorizing Provider  amoxicillin-clavulanate (AUGMENTIN) 875-125 MG tablet Take 1 tablet by mouth every 12 (twelve) hours. 03/11/21  Yes Samari Bittinger, Noberto Retort, PA-C    Family History Family History  Problem Relation Age of Onset   Diabetes Maternal Grandmother      Social History Social History   Tobacco Use   Smoking status: Never   Smokeless tobacco: Never  Vaping Use   Vaping Use: Never used  Substance Use Topics   Alcohol use: No   Drug use: No     Allergies   Patient has no known allergies.   Review of Systems Review of Systems  Constitutional:  Positive for activity change and fatigue. Negative for appetite change and fever.  HENT:  Positive for congestion, postnasal drip, sinus pressure, sore throat and voice change. Negative for sneezing and trouble swallowing.   Respiratory:  Positive for cough. Negative for shortness of breath.   Cardiovascular:  Negative for chest pain.  Gastrointestinal:  Negative for abdominal pain, diarrhea, nausea and vomiting.  Musculoskeletal:  Negative for arthralgias and myalgias.  Neurological:  Negative for dizziness, light-headedness and headaches.    Physical Exam Triage Vital Signs ED Triage Vitals  Enc Vitals Group     BP 03/11/21 1355 123/82     Pulse Rate 03/11/21 1355 99     Resp 03/11/21 1355 17     Temp 03/11/21 1355 98.6 F (37 C)     Temp Source 03/11/21 1355 Oral  SpO2 03/11/21 1355 99 %     Weight --      Height --      Head Circumference --      Peak Flow --      Pain Score 03/11/21 1353 10     Pain Loc --      Pain Edu? --      Excl. in [place]? --    No data found.  Updated Vital Signs BP 123/82 (BP Location: Left Arm)   Pulse 99   Temp 98.6 F (37 C) (Oral)   Resp 17   LMP 02/15/2021   SpO2 99%   Visual Acuity Right Eye Distance:   Left Eye Distance:   Bilateral Distance:    Right Eye Near:   Left Eye Near:    Bilateral Near:     Physical Exam Vitals reviewed.  Constitutional:      General: She is awake. She is not in acute distress.    Appearance: Normal appearance. She is well-developed. She is not ill-appearing.     Comments: Very pleasant female appears stated age in no acute distress sitting comfortably in exam room  HENT:     Head:  Normocephalic and atraumatic.     Right Ear: Ear canal and external ear normal. Tympanic membrane is injected. Tympanic membrane is not erythematous or bulging.     Left Ear: Ear canal and external ear normal. Tympanic membrane is erythematous. Tympanic membrane is not bulging.     Nose:     Right Sinus: Maxillary sinus tenderness present. No frontal sinus tenderness.     Left Sinus: Maxillary sinus tenderness present. No frontal sinus tenderness.     Mouth/Throat:     Pharynx: Uvula midline. Posterior oropharyngeal erythema present. No oropharyngeal exudate.  Cardiovascular:     Rate and Rhythm: Normal rate and regular rhythm.     Heart sounds: Normal heart sounds, S1 normal and S2 normal. No murmur heard. Pulmonary:     Effort: Pulmonary effort is normal.     Breath sounds: Normal breath sounds. No wheezing, rhonchi or rales.     Comments: Clear auscultation bilaterally Lymphadenopathy:     Head:     Right side of head: No submental, submandibular or tonsillar adenopathy.     Left side of head: No submental, submandibular or tonsillar adenopathy.     Cervical: No cervical adenopathy.  Psychiatric:        Behavior: Behavior is cooperative.     UC Treatments / Results  Labs (all labs ordered are listed, but only abnormal results are displayed) Labs Reviewed  POC URINE PREG, ED    EKG   Radiology No results found.  Procedures Procedures (including critical care time)  Medications Ordered in UC Medications - No data to display  Initial Impression / Assessment and Plan / UC Course  I have reviewed the triage vital signs and the nursing notes.  Pertinent labs & imaging results that were available during my care of the patient were reviewed by me and considered in my medical decision making (see chart for details).     No indication for viral testing given patient has been symptomatic for a week.  Given prolonged and worsening symptoms as well as physical exam findings  we will start Augmentin to cover for sinus infection as well as otitis media.  She was instructed to use over-the-counter medications including Mucinex, Flonase, Tylenol for symptom relief.  She is to rest and drink plenty of fluid.  Discussed alarm symptoms that warrant emergent evaluation.  She is to follow-up with PCP within a week to ensure symptom improvement.  Strict return precautions given to which she expressed understanding including if breast tenderness persists or worsens she needs to be seen immediately.  Final Clinical Impressions(s) / UC Diagnoses   Final diagnoses:  Sinobronchitis  Acute cough  Non-recurrent acute suppurative otitis media of left ear without spontaneous rupture of tympanic membrane     Discharge Instructions      Urine pregnancy test was negative.  We are going to treat for a sinus/ear infection.  Please take Augmentin twice daily.  Use Mucinex, Flonase, Tylenol for additional symptom relief.  Make sure you are drinking plenty of fluid.  Follow-up with your PCP next week to ensure symptom improvement.  If you have any severe symptoms including high fever, increased pain, nausea, vomiting you need to be reevaluated.     ED Prescriptions     Medication Sig Dispense Auth. Provider   amoxicillin-clavulanate (AUGMENTIN) 875-125 MG tablet Take 1 tablet by mouth every 12 (twelve) hours. 14 tablet Lovis More, Noberto Retort, PA-C      PDMP not reviewed this encounter.   Jeani Hawking, PA-C 03/11/21 1420

## 2021-03-21 DIAGNOSIS — N644 Mastodynia: Secondary | ICD-10-CM | POA: Diagnosis not present

## 2021-03-21 DIAGNOSIS — Z1239 Encounter for other screening for malignant neoplasm of breast: Secondary | ICD-10-CM | POA: Diagnosis not present

## 2021-03-21 DIAGNOSIS — Z6823 Body mass index (BMI) 23.0-23.9, adult: Secondary | ICD-10-CM | POA: Diagnosis not present

## 2021-03-21 DIAGNOSIS — Z0000 Encounter for general adult medical examination without abnormal findings: Secondary | ICD-10-CM | POA: Diagnosis not present

## 2021-03-21 DIAGNOSIS — Z124 Encounter for screening for malignant neoplasm of cervix: Secondary | ICD-10-CM | POA: Diagnosis not present

## 2021-03-21 DIAGNOSIS — Z0001 Encounter for general adult medical examination with abnormal findings: Secondary | ICD-10-CM | POA: Diagnosis not present

## 2021-03-21 DIAGNOSIS — Z113 Encounter for screening for infections with a predominantly sexual mode of transmission: Secondary | ICD-10-CM | POA: Diagnosis not present

## 2021-03-27 ENCOUNTER — Other Ambulatory Visit: Payer: Self-pay | Admitting: Obstetrics & Gynecology

## 2021-03-27 DIAGNOSIS — N644 Mastodynia: Secondary | ICD-10-CM

## 2021-05-07 ENCOUNTER — Other Ambulatory Visit: Payer: Medicaid Other

## 2021-05-14 ENCOUNTER — Encounter: Payer: Self-pay | Admitting: Emergency Medicine

## 2021-05-14 ENCOUNTER — Ambulatory Visit
Admission: EM | Admit: 2021-05-14 | Discharge: 2021-05-14 | Disposition: A | Discharge status: Home / Self Care | Payer: Medicaid Other | Attending: Student | Admitting: Student

## 2021-05-14 ENCOUNTER — Other Ambulatory Visit: Payer: Self-pay

## 2021-05-14 DIAGNOSIS — R0789 Other chest pain: Secondary | ICD-10-CM | POA: Diagnosis not present

## 2021-05-14 MED ORDER — CYCLOBENZAPRINE HCL 10 MG PO TABS: 10.0000 mg | ORAL_TABLET | Freq: Two times a day (BID) | ORAL | 0 refills | Status: AC | PRN

## 2021-05-14 NOTE — ED Triage Notes (Signed)
Pt presents with chest pain, SOB, and right side rib pain x 5 days

## 2021-05-14 NOTE — Discharge Instructions (Addendum)
-  Flexeril twice daily as needed for muscular pain. This can cause drowsiness so take at bedtime. -You can take Tylenol up to 1000 mg 3 times daily, and ibuprofen up to 600 mg 3 times daily with food.  You can take these together, or alternate every 3-4 hours. -Heating pad -Follow-up if symptoms worsen/persist despite treatment - chest pain, shortness of breath, new dizziness, etc

## 2021-05-14 NOTE — ED Provider Notes (Signed)
[facility]    CSN: [id number] Arrival date & time: 05/14/21  1531      History   Chief Complaint Chief Complaint  Patient presents with   Chest Pain   Shortness of Breath    HPI Elizabeth Sexton is a 31 y.o. female presenting with right-sided chest wall pain. Medical history bronchitis.  Describes right-sided chest wall pain for about 5 days. Pain only with movement and deep inspiration. States if she holds her breath there is no pain. Also with R breast pain for weeks, mammogram is scheduled already by PCP. She has not attempted ANY interventions for her condition. Denies any left-sided chest pain, dizziness, weakness, headaches. Denies trauma, overuse, falls, etc.   HPI  Past Medical History:  Diagnosis Date   Eczema     Patient Active Problem List   Diagnosis Date Noted   Contact dermatitis due to chemicals 01/12/2017   Eczema 05/28/2015   Bacteriuria 05/22/2015    Past Surgical History:  Procedure Laterality Date   NO PAST SURGERIES      OB History     Gravida  1   Para  1   Term  1   Preterm      AB      Living  1      SAB      IAB      Ectopic      Multiple  0   Live Births  1            Home Medications    Prior to Admission medications   Medication Sig Start Date End Date Taking? Authorizing Provider  cyclobenzaprine (FLEXERIL) 10 MG tablet Take 1 tablet (10 mg total) by mouth 2 (two) times daily as needed for muscle spasms. 05/14/21  Yes Rhys Martini, PA-C  amoxicillin-clavulanate (AUGMENTIN) 875-125 MG tablet Take 1 tablet by mouth every 12 (twelve) hours. 03/11/21   Raspet, Noberto Retort, PA-C    Family History Family History  Problem Relation Age of Onset   Diabetes Maternal Grandmother     Social History Social History   Tobacco Use   Smoking status: Never   Smokeless tobacco: Never  Vaping Use   Vaping Use: Never used  Substance Use Topics   Alcohol use: No   Drug use: No     Allergies   Patient has  no known allergies.   Review of Systems Review of Systems  Musculoskeletal:        Right-sided chest wall pain   All other systems reviewed and are negative.   Physical Exam Triage Vital Signs ED Triage Vitals  Enc Vitals Group     BP      Pulse      Resp      Temp      Temp src      SpO2      Weight      Height      Head Circumference      Peak Flow      Pain Score      Pain Loc      Pain Edu?      Excl. in [place]?    No data found.  Updated Vital Signs BP 133/85 (BP Location: Left Arm)    Pulse 93    Temp 98.3 F (36.8 C) (Oral)    Resp 18    LMP 05/14/2021    SpO2 99%   Visual Acuity Right Eye Distance:  Left Eye Distance:   Bilateral Distance:    Right Eye Near:   Left Eye Near:    Bilateral Near:     Physical Exam Vitals reviewed.  Constitutional:      Appearance: Normal appearance. She is not diaphoretic.  HENT:     Head: Normocephalic and atraumatic.     Mouth/Throat:     Mouth: Mucous membranes are moist.  Eyes:     Extraocular Movements: Extraocular movements intact.     Pupils: Pupils are equal, round, and reactive to light.  Cardiovascular:     Rate and Rhythm: Normal rate and regular rhythm.     Pulses:          Radial pulses are 2+ on the right side and 2+ on the left side.     Heart sounds: Normal heart sounds.  Pulmonary:     Effort: Pulmonary effort is normal.     Breath sounds: Normal breath sounds.  Chest:     Chest wall: Tenderness present. No swelling.     Comments: Diffusely TTP R anterior chest wall  Abdominal:     Palpations: Abdomen is soft.     Tenderness: There is no abdominal tenderness. There is no guarding or rebound.  Musculoskeletal:     Right lower leg: No edema.     Left lower leg: No edema.  Skin:    General: Skin is warm.     Capillary Refill: Capillary refill takes less than 2 seconds.  Neurological:     General: No focal deficit present.     Mental Status: She is alert and oriented to person, place, and  time.  Psychiatric:        Mood and Affect: Mood normal.        Behavior: Behavior normal.        Thought Content: Thought content normal.        Judgment: Judgment normal.     UC Treatments / Results  Labs (all labs ordered are listed, but only abnormal results are displayed) Labs Reviewed - No data to display  EKG   Radiology No results found.  Procedures Procedures (including critical care time)  Medications Ordered in UC Medications - No data to display  Initial Impression / Assessment and Plan / UC Course  I have reviewed the triage vital signs and the nursing notes.  Pertinent labs & imaging results that were available during my care of the patient were reviewed by me and considered in my medical decision making (see chart for details).     This patient is a very pleasant 31 y.o. year old female presenting with right-sided chest wall pain. Marland Kitchenafebrile, nontachy. Breath sounds clear throughout. Pain is reproducible. She is not on OCP. No history DVT/PE. Wells score for PE is 0. Trial of ibuprofen, heating pad, and flexeril. ED return precautions discussed. Patient verbalizes understanding and agreement.     Final Clinical Impressions(s) / UC Diagnoses   Final diagnoses:  Right-sided chest wall pain     Discharge Instructions      -Flexeril twice daily as needed for muscular pain. This can cause drowsiness so take at bedtime. -You can take Tylenol up to 1000 mg 3 times daily, and ibuprofen up to 600 mg 3 times daily with food.  You can take these together, or alternate every 3-4 hours. -Heating pad -Follow-up if symptoms worsen/persist despite treatment - chest pain, shortness of breath, new dizziness, etc     ED Prescriptions  Medication Sig Dispense Auth. Provider   cyclobenzaprine (FLEXERIL) 10 MG tablet Take 1 tablet (10 mg total) by mouth 2 (two) times daily as needed for muscle spasms. 20 tablet Hazel Sams, PA-C      PDMP not reviewed this  encounter.   Hazel Sams, PA-C 05/14/21 [phone]

## 2021-05-16 ENCOUNTER — Other Ambulatory Visit: Payer: Self-pay

## 2021-05-16 ENCOUNTER — Emergency Department (HOSPITAL_COMMUNITY): Payer: Medicaid Other

## 2021-05-16 ENCOUNTER — Encounter (HOSPITAL_COMMUNITY): Payer: Self-pay | Admitting: Emergency Medicine

## 2021-05-16 ENCOUNTER — Emergency Department (HOSPITAL_COMMUNITY)
Admission: EM | Admit: 2021-05-16 | Discharge: 2021-05-16 | Disposition: A | Discharge status: Home / Self Care | Payer: Medicaid Other | Attending: Emergency Medicine | Admitting: Emergency Medicine

## 2021-05-16 DIAGNOSIS — R871 Abnormal level of hormones in specimens from female genital organs: Secondary | ICD-10-CM | POA: Diagnosis not present

## 2021-05-16 DIAGNOSIS — U071 COVID-19: Secondary | ICD-10-CM | POA: Insufficient documentation

## 2021-05-16 DIAGNOSIS — O039 Complete or unspecified spontaneous abortion without complication: Secondary | ICD-10-CM | POA: Diagnosis not present

## 2021-05-16 DIAGNOSIS — R102 Pelvic and perineal pain: Secondary | ICD-10-CM | POA: Diagnosis not present

## 2021-05-16 DIAGNOSIS — R091 Pleurisy: Secondary | ICD-10-CM | POA: Diagnosis not present

## 2021-05-16 DIAGNOSIS — R000 Tachycardia, unspecified: Secondary | ICD-10-CM | POA: Diagnosis not present

## 2021-05-16 DIAGNOSIS — O209 Hemorrhage in early pregnancy, unspecified: Secondary | ICD-10-CM | POA: Diagnosis not present

## 2021-05-16 DIAGNOSIS — R1011 Right upper quadrant pain: Secondary | ICD-10-CM

## 2021-05-16 DIAGNOSIS — R0789 Other chest pain: Secondary | ICD-10-CM | POA: Diagnosis not present

## 2021-05-16 DIAGNOSIS — R0602 Shortness of breath: Secondary | ICD-10-CM | POA: Diagnosis present

## 2021-05-16 DIAGNOSIS — Z3A00 Weeks of gestation of pregnancy not specified: Secondary | ICD-10-CM | POA: Diagnosis not present

## 2021-05-16 DIAGNOSIS — E349 Endocrine disorder, unspecified: Secondary | ICD-10-CM

## 2021-05-16 DIAGNOSIS — R079 Chest pain, unspecified: Secondary | ICD-10-CM | POA: Diagnosis not present

## 2021-05-16 LAB — BASIC METABOLIC PANEL
Anion gap: 9 (ref 5–15)
BUN: 5 mg/dL — ABNORMAL LOW (ref 6–20)
CO2: 25 mmol/L (ref 22–32)
Calcium: 9.4 mg/dL (ref 8.9–10.3)
Chloride: 101 mmol/L (ref 98–111)
Creatinine, Ser: 0.69 mg/dL (ref 0.44–1.00)
GFR, Estimated: >60 mL/min (ref >60)
Glucose, Bld: 90 mg/dL (ref 70–99)
Potassium: 3.7 mmol/L (ref 3.5–5.1)
Sodium: 135 mmol/L (ref 135–145)

## 2021-05-16 LAB — CBC
HCT: 34.4 % — ABNORMAL LOW (ref 36.0–46.0)
Hemoglobin: 11.3 g/dL — ABNORMAL LOW (ref 12.0–15.0)
MCH: 31.4 pg (ref 26.0–34.0)
MCHC: 32.8 g/dL (ref 30.0–36.0)
MCV: 95.6 fL (ref 80.0–100.0)
Platelets: 297 10*3/uL (ref 150–400)
RBC: 3.6 MIL/uL — ABNORMAL LOW (ref 3.87–5.11)
RDW: 12.7 % (ref 11.5–15.5)
WBC: 10.9 10*3/uL — ABNORMAL HIGH (ref 4.0–10.5)
nRBC: 0 % (ref 0.0–0.2)

## 2021-05-16 LAB — RESP PANEL BY RT-PCR (FLU A&B, COVID) ARPGX2
Influenza A by PCR: NEGATIVE
Influenza B by PCR: NEGATIVE
SARS Coronavirus 2 by RT PCR: POSITIVE — AB

## 2021-05-16 LAB — HEPATIC FUNCTION PANEL
ALT: 38 U/L (ref 0–44)
AST: 33 U/L (ref 15–41)
Albumin: 4 g/dL (ref 3.5–5.0)
Alkaline Phosphatase: 65 U/L (ref 38–126)
Bilirubin, Direct: 0.2 mg/dL (ref 0.0–0.2)
Indirect Bilirubin: 1 mg/dL — ABNORMAL HIGH (ref 0.3–0.9)
Total Bilirubin: 1.2 mg/dL (ref 0.3–1.2)
Total Protein: 8 g/dL (ref 6.5–8.1)

## 2021-05-16 LAB — I-STAT BETA HCG BLOOD, ED (MC, WL, AP ONLY): I-stat hCG, quantitative: 11.5 m[IU]/mL — ABNORMAL HIGH (ref <5)

## 2021-05-16 LAB — TROPONIN I (HIGH SENSITIVITY)
Troponin I (High Sensitivity): <2 ng/L (ref <18)
Troponin I (High Sensitivity): <2 ng/L (ref <18)

## 2021-05-16 LAB — LIPASE, BLOOD: Lipase: 28 U/L (ref 11–51)

## 2021-05-16 NOTE — ED Triage Notes (Signed)
Patient coming from home, complaint of chest pain and shortness of breath for several days. VSS. NAD.

## 2021-05-16 NOTE — ED Provider Notes (Signed)
[facility] EMERGENC[facility] DEPARTMENT Provider Note   CSN: [id number] Arrival date & time: 05/16/21  1541     History  Chief Complaint  Patient presents with   Chest Pain   Shortness of Breath    Elizabeth Sexton is a 31 y.o. female.   Chest Pain Associated symptoms: shortness of breath   Associated symptoms: no fever   Shortness of Breath Associated symptoms: chest pain   Associated symptoms: no fever     Pt started having pain in her chest several days ago.  It has been on both sides.  It is aching and sharp and sometimes tight.  She is feeling short of breath.  It gets worse when she is active walking around.   It gets worse at night.  She also has sx with taking deep breaths.  Pt denies any coughing but she does occasionally.  No hx of heart disease or lung disease.   Patient also has noticed some pain in her upper abdomen.  She is also having vaginal bleeding right now.  Patient states this is a normal time for her menstrual period but it is a little bit irregular.  Home Medications Prior to Admission medications   Medication Sig Start Date End Date Taking? Authorizing Provider  cyclobenzaprine (FLEXERIL) 10 MG tablet Take 1 tablet (10 mg total) by mouth 2 (two) times daily as needed for muscle spasms. 05/14/21  Yes Hazel Sams, PA-C  Multiple Vitamins-Minerals (MULTIVITAMIN ADULTS) TABS Take 1 tablet by mouth daily.   Yes [provider]  amoxicillin-clavulanate (AUGMENTIN) 875-125 MG tablet Take 1 tablet by mouth every 12 (twelve) hours. Patient not taking: Reported on 05/16/2021 03/11/21   Raspet, Derry Skill, PA-C      Allergies    Patient has no known allergies.    Review of Systems   Review of Systems  Constitutional:  Negative for fever.  Respiratory:  Positive for shortness of breath.   Cardiovascular:  Positive for chest pain.   Physical Exam Updated Vital Signs BP 135/87 (BP Location: Left Arm)    Pulse (!) 104    Temp 98.6 F (37 C)     Resp 16    LMP 05/14/2021    SpO2 100%  Physical Exam Vitals and nursing note reviewed.  Constitutional:      General: She is not in acute distress.    Appearance: She is well-developed.  HENT:     Head: Normocephalic and atraumatic.     Right Ear: External ear normal.     Left Ear: External ear normal.  Eyes:     General: No scleral icterus.       Right eye: No discharge.        Left eye: No discharge.     Conjunctiva/sclera: Conjunctivae normal.  Neck:     Trachea: No tracheal deviation.  Cardiovascular:     Rate and Rhythm: Normal rate and regular rhythm.  Pulmonary:     Effort: Pulmonary effort is normal. No respiratory distress.     Breath sounds: Normal breath sounds. No stridor. No wheezing or rales.  Abdominal:     General: Bowel sounds are normal. There is no distension.     Palpations: Abdomen is soft.     Tenderness: There is no abdominal tenderness. There is no guarding or rebound.  Musculoskeletal:        General: No tenderness or deformity.     Cervical back: Neck supple.  Skin:    General:  Skin is warm and dry.     Findings: No rash.  Neurological:     General: No focal deficit present.     Mental Status: She is alert.     Cranial Nerves: No cranial nerve deficit (no facial droop, extraocular movements intact, no slurred speech).     Sensory: No sensory deficit.     Motor: No abnormal muscle tone or seizure activity.     Coordination: Coordination normal.  Psychiatric:        Mood and Affect: Mood normal.    ED Results / Procedures / Treatments   Labs (all labs ordered are listed, but only abnormal results are displayed) Labs Reviewed  RESP PANEL BY RT-PCR (FLU A&B, COVID) ARPGX2 - Abnormal; Notable for the following components:      Result Value   SARS Coronavirus 2 by RT PCR POSITIVE (*)    All other components within normal limits  BASIC METABOLIC PANEL - Abnormal; Notable for the following components:   BUN 5 (*)    All other components within  normal limits  CBC - Abnormal; Notable for the following components:   WBC 10.9 (*)    RBC 3.60 (*)    Hemoglobin 11.3 (*)    HCT 34.4 (*)    All other components within normal limits  HEPATIC FUNCTION PANEL - Abnormal; Notable for the following components:   Indirect Bilirubin 1.0 (*)    All other components within normal limits  I-STAT BETA HCG BLOOD, ED ([facility], [facility], [facility] ONLY) - Abnormal; Notable for the following components:   I-stat hCG, quantitative 11.5 (*)    All other components within normal limits  LIPASE, BLOOD  TROPONIN I (HIGH SENSITIVITY)  TROPONIN I (HIGH SENSITIVITY)    EKG EKG Interpretation  Date/Time:  Thursday May 16 2021 16:23:58 EST Ventricular Rate:  104 PR Interval:  152 QRS Duration: 76 QT Interval:  322 QTC Calculation: 423 R Axis:   73 Text Interpretation: Sinus tachycardia Otherwise normal ECG No previous ECGs available Confirmed by Dorie Rank [phone]) on 05/16/2021 11:08:51 PM  Radiology DG Chest 2 View  Result Date: 05/16/2021 CLINICAL DATA:  Chest pain shortness EXAM: CHEST - 2 VIEW COMPARISON:  None. FINDINGS: The heart size and mediastinal contours are within normal limits. Both lungs are clear. The visualized skeletal structures are unremarkable except for minor scoliosis of the thoracic spine. Trachea midline. Nonobstructive gas pattern. IMPRESSION: No active cardiopulmonary disease. Electronically Signed   By: Jerilynn Mages.  Shick M.D.   On: 05/16/2021 16:59   US OB LESS THAN 14 WEEKS W/ OB TRANSVAGINAL AND DOPPLER  Result Date: 05/16/2021 CLINICAL DATA:  Pelvic pain and vaginal bleeding with positive pregnancy test EXAM: OBSTETRIC <14 WK [place] AND TRANSVAGINAL OB US DOPPLER ULTRASOUND OF OVARIES TECHNIQUE: Both transabdominal and transvaginal ultrasound examinations were performed for complete evaluation of the gestation as well as the maternal uterus, adnexal regions, and pelvic cul-de-sac. Transvaginal technique was performed to assess early pregnancy. Color  and duplex Doppler ultrasound was utilized to evaluate blood flow to the ovaries. COMPARISON:  None. FINDINGS: Intrauterine gestational sac: Absent Maternal uterus/adnexae: Ovaries are well visualized. No findings to suggest ectopic pregnancy are seen. The left ovary is enlarged in size with a central 3.9 cm complex appearing cystic lesion most consistent with a hemorrhagic cyst. Moderate free fluid is noted within the pelvis. Pulsed Doppler evaluation of both ovaries demonstrates normal appearing low-resistance arterial and venous waveforms. IMPRESSION: No evidence of intrauterine or extrauterine gestation. Follow-up exam  can be performed as clinically indicated. Changes consistent with a hemorrhagic cyst in the left ovary. No follow-up is recommended. Electronically Signed   By: Inez Catalina M.D.   On: 05/16/2021 22:41   US Abdomen Limited RUQ (LIVER/GB)  Result Date: 05/16/2021 CLINICAL DATA:  Right upper quadrant pain. EXAM: ULTRASOUND ABDOMEN LIMITED RIGHT UPPER QUADRANT COMPARISON:  None. FINDINGS: Gallbladder: No gallstones or wall thickening visualized. No sonographic Murphy sign noted by sonographer. Common bile duct: Diameter: 2.3 mm. Liver: No focal lesion identified. Within normal limits in parenchymal echogenicity. Portal vein is patent on color Doppler imaging with normal direction of blood flow towards the liver. Other: No free fluid. IMPRESSION: Normal evaluation. Electronically Signed   By: Brett Fairy M.D.   On: 05/16/2021 22:32    Procedures Procedures    Medications Ordered in ED Medications - No data to display  ED Course/ Medical Decision Making/ A&P Clinical Course as of 05/16/21 2311  Thu May 16, 2021  2029 CBC(!) Anemia stable [JK]  123456 Basic metabolic panel(!) nl [JK]  2030 Troponin I (High Sensitivity) nl [JK]  2030 DG Chest 2 View nl [JK]  2044 I-Stat beta hCG blood, ED(!) hCG slightly increased.  Is possible this could be a false positive [JK]  2239 Ultrasound  gallbladder without acute findings [JK]  2239 Resp Panel by RT-PCR (Flu A&B, Covid) Nasopharyngeal Swab(!) Covid positive [JK]  2241 Low risk Wells score.  Doubt PE [JK]  2253 Ultrasound without evidence of ectopic or IUP [JK]    Clinical Course User Index [JK] Dorie Rank, MD                           Medical Decision Making Amount and/or Complexity of Data Reviewed Labs: ordered. Decision-making details documented in ED Course. Radiology: ordered. Decision-making details documented in ED Course. ECG/medicine tests: ordered.   Pleuritic chest pain. Patient's symptoms not suggestive of ACS.  EKG reassuring.  Serial troponin normal.  Low risk Wells score.  Doubt PE.  Patient is COVID-positive and this would certainly account for her symptoms and pleuritic chest discomfort.  Abdominal pain Patient did have some tenderness in her upper abdomen.  Consider the possibility of biliary colic.  Ultrasound does not show any signs of gallstones or other acute abnormalities.  Elevated hCG. hCG is slightly above normal.  Doubt this is consistent with early pregnancy but certainly cannot exclude.  Ultrasound was performed patient does not have any evidence of ectopic.  Outpatient follow-up with PCP to have that rechecked  Evaluation and diagnostic testing in the emergency department does not suggest an emergent condition requiring admission or immediate intervention beyond what has been performed at this time.  The patient is safe for discharge and has been instructed to return immediately for worsening symptoms, change in symptoms or any other concerns.         Final Clinical Impression(s) / ED Diagnoses Final diagnoses:  RUQ pain  COVID  Elevated serum hCG  Pleurisy    Rx / DC Orders ED Discharge Orders     None         Dorie Rank, MD 05/16/21 2311

## 2021-05-16 NOTE — Discharge Instructions (Addendum)
The pregnancy blood test was slightly elevated.  Follow up with your doctor to have it rechecked in the next week.  This is most likely a false positive but it is possible that you are in the very early stages of pregnancy.  The covid test was positive.  Take over the counter medications as needed to help with the discomfort in your chest.

## 2021-05-17 ENCOUNTER — Telehealth: Payer: Self-pay

## 2021-05-17 NOTE — Telephone Encounter (Signed)
Transition Care Management Unsuccessful Follow-up Telephone Call  Date of discharge and from where:  05/16/2021 from [facility]  Attempts:  1st Attempt  Reason for unsuccessful TCM follow-up call:  No answer/busy

## 2021-05-20 DIAGNOSIS — N83292 Other ovarian cyst, left side: Secondary | ICD-10-CM | POA: Diagnosis not present

## 2021-05-20 DIAGNOSIS — Z3009 Encounter for other general counseling and advice on contraception: Secondary | ICD-10-CM | POA: Diagnosis not present

## 2021-05-20 DIAGNOSIS — O200 Threatened abortion: Secondary | ICD-10-CM | POA: Diagnosis not present

## 2021-05-20 DIAGNOSIS — O4691 Antepartum hemorrhage, unspecified, first trimester: Secondary | ICD-10-CM | POA: Diagnosis not present

## 2021-05-20 NOTE — Telephone Encounter (Signed)
Transition Care Management Unsuccessful Follow-up Telephone Call  Date of discharge and from where:  05/16/2021 [facility]  Attempts:  2nd Attempt  Reason for unsuccessful TCM follow-up call:  No answer/busy

## 2021-05-21 NOTE — Telephone Encounter (Signed)
Transition Care Management Unsuccessful Follow-up Telephone Call ° °Date of discharge and from where:  05/16/2021 from [facility] ° °Attempts:  3rd Attempt ° °Reason for unsuccessful TCM follow-up call:  Unable to reach patient ° ° ° °

## 2021-05-31 ENCOUNTER — Ambulatory Visit: Payer: Medicaid Other

## 2021-05-31 ENCOUNTER — Ambulatory Visit
Admission: RE | Admit: 2021-05-31 | Discharge: 2021-05-31 | Disposition: A | Discharge status: Home / Self Care | Payer: Medicaid Other | Source: Ambulatory Visit | Attending: Obstetrics & Gynecology | Admitting: Obstetrics & Gynecology

## 2021-05-31 DIAGNOSIS — N644 Mastodynia: Secondary | ICD-10-CM

## 2021-05-31 DIAGNOSIS — R922 Inconclusive mammogram: Secondary | ICD-10-CM | POA: Diagnosis not present

## 2021-06-06 ENCOUNTER — Other Ambulatory Visit: Payer: Medicaid Other

## 2021-06-11 DIAGNOSIS — N83292 Other ovarian cyst, left side: Secondary | ICD-10-CM | POA: Diagnosis not present

## 2021-06-11 DIAGNOSIS — R109 Unspecified abdominal pain: Secondary | ICD-10-CM | POA: Diagnosis not present

## 2021-06-11 DIAGNOSIS — N83291 Other ovarian cyst, right side: Secondary | ICD-10-CM | POA: Diagnosis not present

## 2022-10-31 DIAGNOSIS — R102 Pelvic and perineal pain: Secondary | ICD-10-CM | POA: Diagnosis not present

## 2022-10-31 DIAGNOSIS — M545 Low back pain, unspecified: Secondary | ICD-10-CM | POA: Diagnosis not present

## 2022-10-31 DIAGNOSIS — M5459 Other low back pain: Secondary | ICD-10-CM | POA: Diagnosis not present

## 2022-11-04 ENCOUNTER — Other Ambulatory Visit: Payer: Self-pay | Admitting: Obstetrics & Gynecology

## 2022-11-04 DIAGNOSIS — R102 Pelvic and perineal pain: Secondary | ICD-10-CM

## 2022-12-06 LAB — AMB RESULTS CONSOLE CBG: Glucose: 140

## 2022-12-06 NOTE — Progress Notes (Unsigned)
Patient attended screening event 12/06/2022. Provided PCP community clinic listing. Blood pressure and blood glucose WNL. Provided food, transportation and utility SDOH resources.

## 2022-12-17 ENCOUNTER — Encounter: Payer: Self-pay | Admitting: *Deleted

## 2022-12-17 NOTE — Progress Notes (Signed)
Pt attended 12/06/22 screening event where her b/p was 117/84 and her blood sugar was 140. At the event, the pt notified staff that she did  not have a PCP and did identify food, transportation, and utility insecurities, for which she was given resources. Chart review indicates pt has ongoing access to OB/GYN care, where she was last seen by Dr. Sallye Ober on 10/31/22. During phone f/u, pt noted she has not yet had time to review the PCP info shared with her at the event but was not currently experiencing any specific food or utility needs. Pt also noted she had Medicaid and this health equity team member and pt discussed the Medicaid transportation options for health care appt. We further discussed the safety of establishing care with the PCP of her choice before an acute need occurs, allowing her to meet and establish care at a clinic where she feels comfortable. Pt stated she still had her [facility] PCP clinic flyer info and did not need any additional resources mailed to her at this time.

## 2023-02-16 ENCOUNTER — Encounter: Payer: Self-pay | Admitting: *Deleted

## 2023-02-16 NOTE — Progress Notes (Signed)
Pt attended 12/06/22 screening event where her b/p was 117/84 and her blood sugar was 140. At the event, the pt identified food, transportation,and utilities insecurities and was given resources at the event. Pt also noted at the event that she did not have a PCP and was given PCP resources within [facility]. During the initial event f/u call, pt shared she had used her ob/gyn provider, Dr. Sallye Ober for primary care but wanted to review PCP options and still had her SDOH need resources. During this 60 day event f/u, health equity team member unable to contact pt by phone (VM left) and repeat PCP ([facility] flyer and [facility] clinic flyer) mailed as well as SDOH need resources and [place] [address] contact info in case needed.Additional pt f/u to be scheduled per health equity protocol

## 2023-03-15 DIAGNOSIS — H6121 Impacted cerumen, right ear: Secondary | ICD-10-CM | POA: Diagnosis not present

## 2023-03-15 DIAGNOSIS — J029 Acute pharyngitis, unspecified: Secondary | ICD-10-CM | POA: Diagnosis not present

## 2023-03-31 IMAGING — MG DIGITAL DIAGNOSTIC BILAT W/ TOMO W/ CAD
8 series · 8 of 24 positions shown · non-contrast
Comparison: 11/02/2019.

CLINICAL DATA: 30-year-old presenting with nonfocal pain in the
outer portions of both breasts which occurred approximately 1-2
months ago and lasted for approximately 1 week.

Patient states no family history of breast cancer.
EXAM:
DIGITAL DIAGNOSTIC BILATERAL MAMMOGRAM WITH TOMOSYNTHESIS AND CAD
TECHNIQUE: Bilateral digital diagnostic mammography and breast tomosynthesis
was performed. The images were evaluated with computer-aided
detection.

[R CC synth-2D]
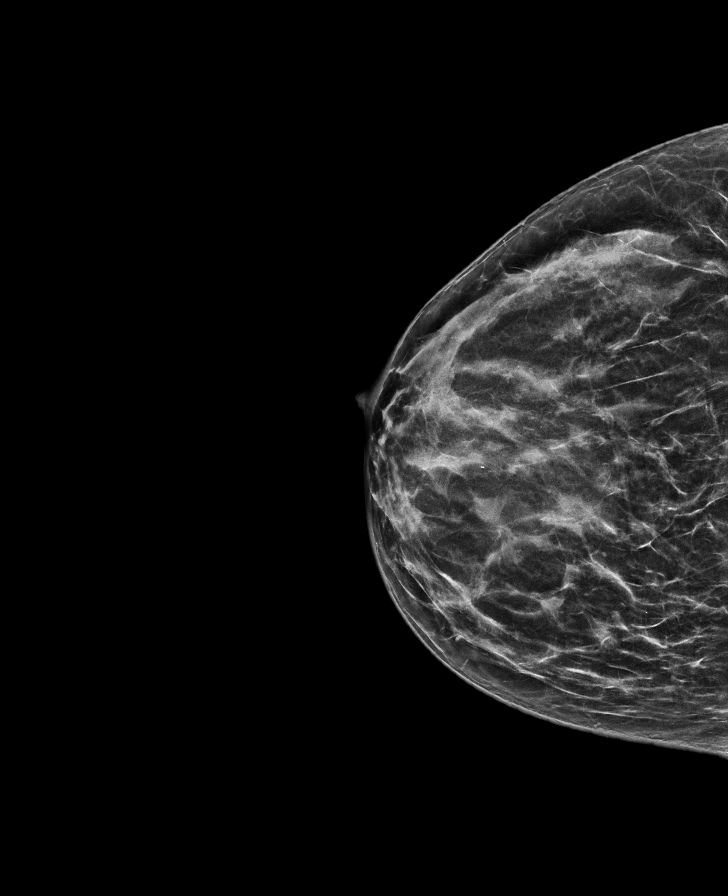

[L CC synth-2D]
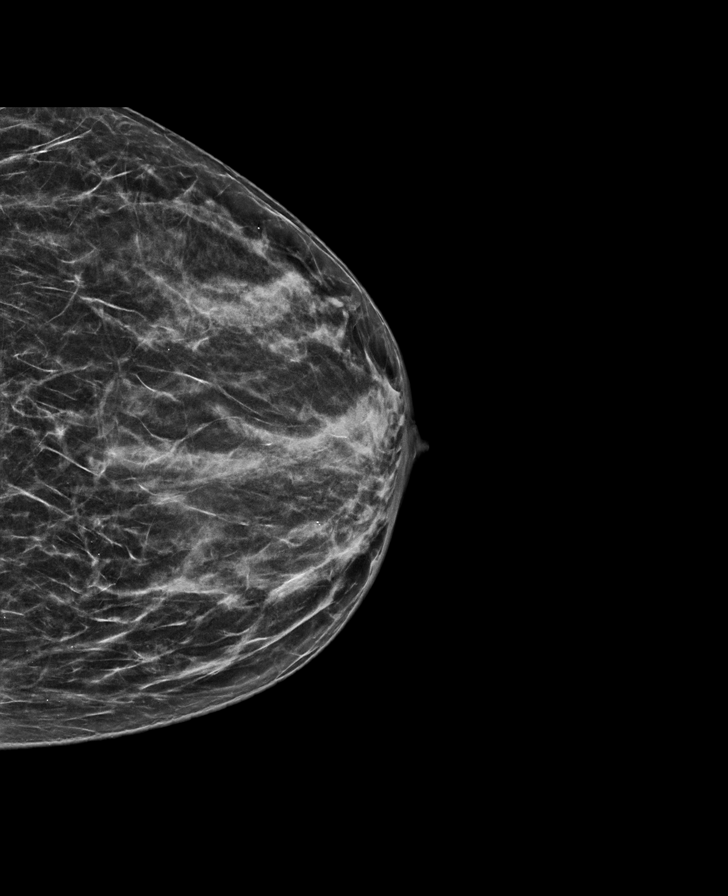

[L MLO synth-2D]
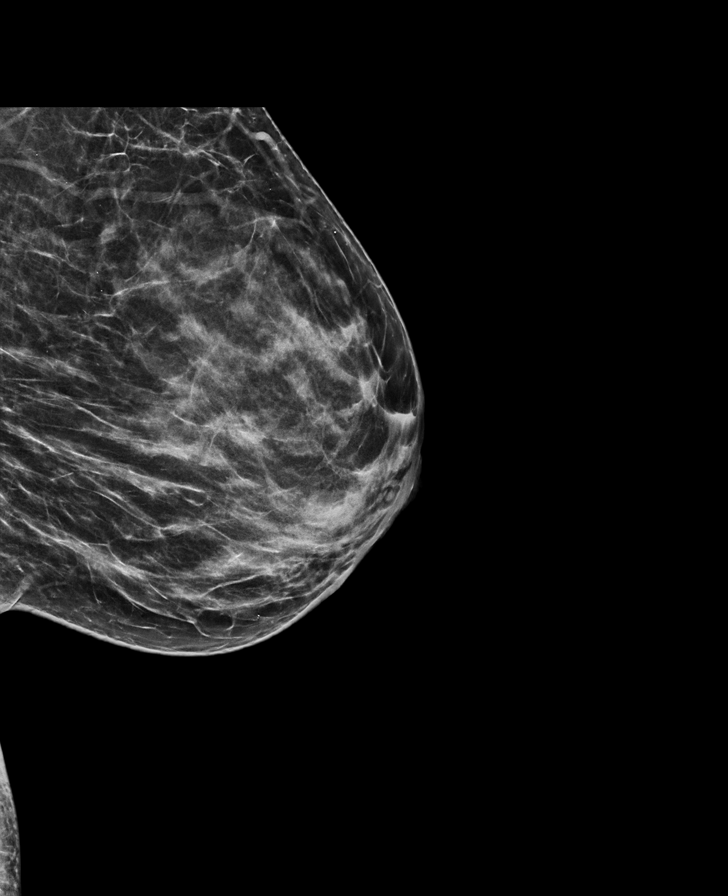

[R MLO synth-2D]
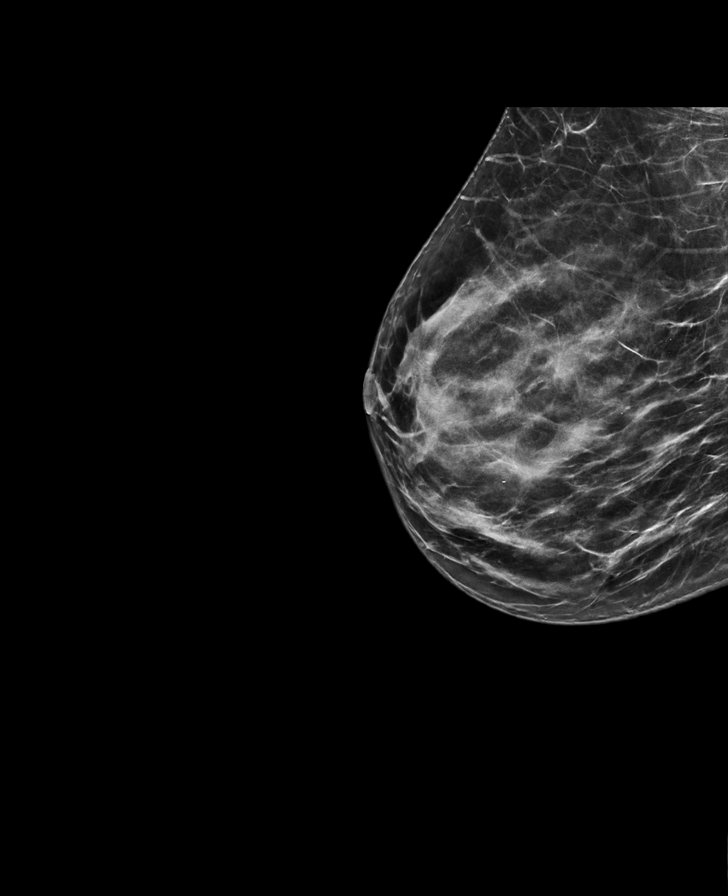

[R CC tomo · tomo slice 28/55.0]
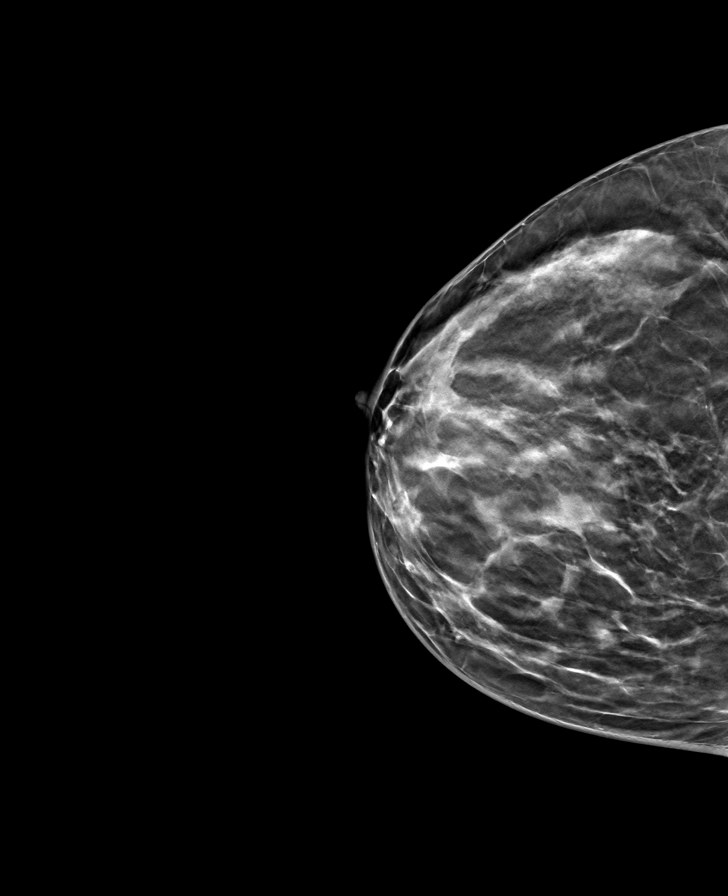

[R MLO tomo · tomo slice 32/63.0]
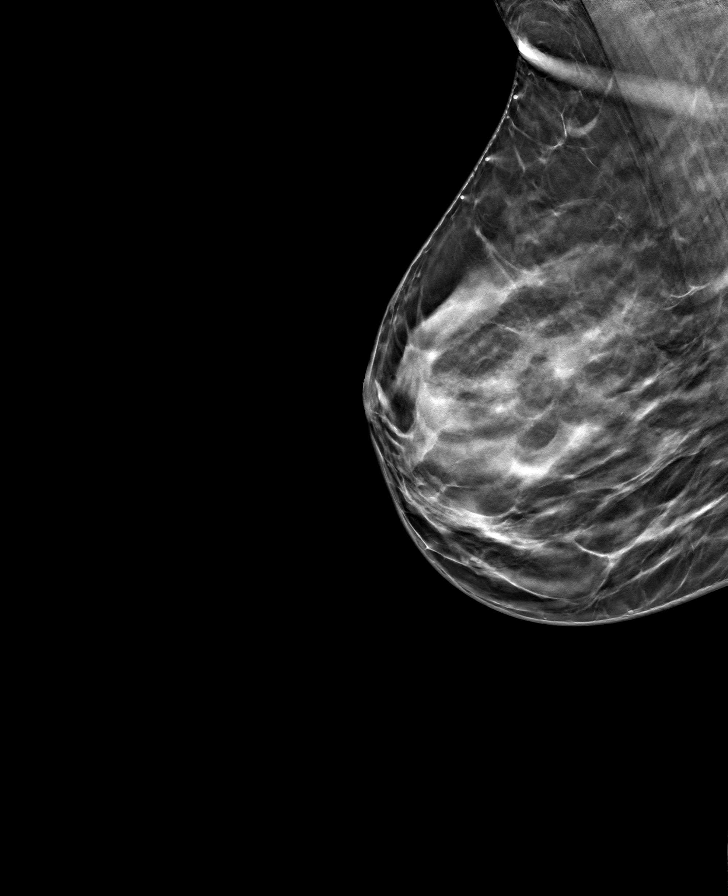

[L MLO tomo · tomo slice 31/60.0]
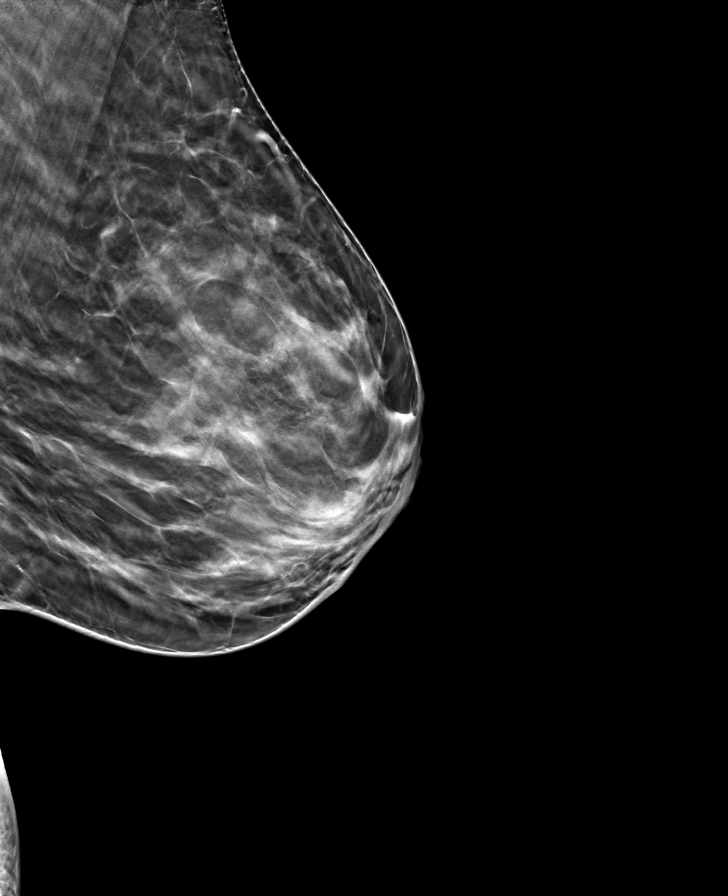

[L CC tomo · tomo slice 29/57.0]
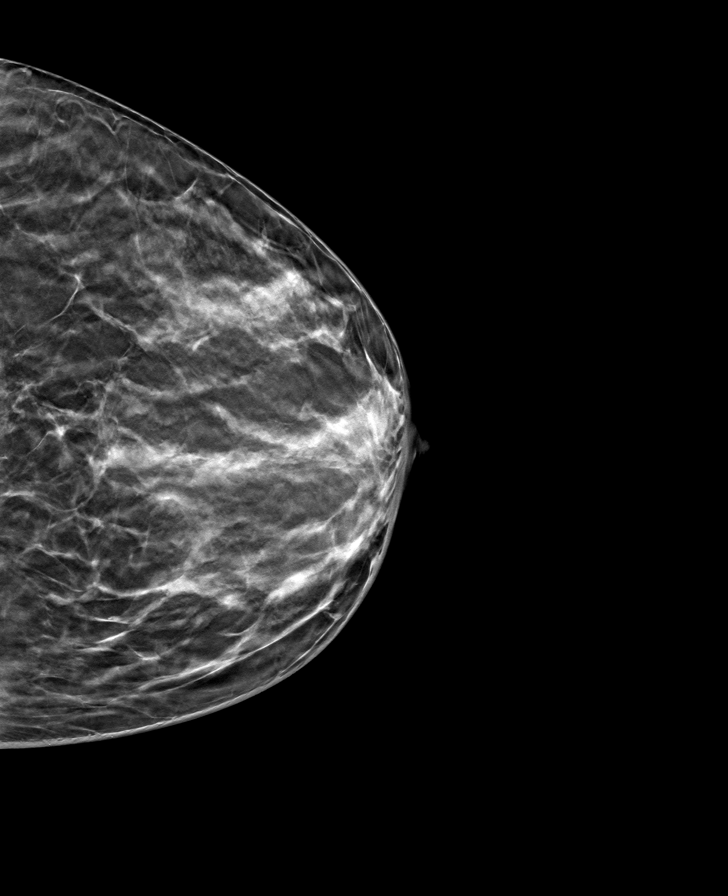

[8 of 24 positions shown; findings below may reference images not displayed]

ACR Breast Density Category c: The breast tissue is heterogeneously
dense, which may obscure small masses.
FINDINGS: Full field CC and MLO views of both breasts were obtained.

RIGHT: No findings suspicious for malignancy.

LEFT: No findings suspicious for malignancy.
IMPRESSION: No mammographic evidence of malignancy involving either breast.

RECOMMENDATION:
Screening mammogram at age 38 unless there are persistent or
subsequent clinical concerns. (Code:[id number]-[id number]-WQW)

I have discussed the findings and recommendations with the patient.
Strategies for alleviating breast pain including decreasing caffeine
intake and vitamin-E supplementation were discussed with the
patient.

BI-RADS CATEGORY  1: Negative.

## 2023-06-16 ENCOUNTER — Encounter: Payer: Self-pay | Admitting: *Deleted

## 2023-06-16 NOTE — Progress Notes (Signed)
 Pt attended 12/06/22 screening event where her b/p was 117/84 and her blood sugar was 140. At the event, the pt identified food, transportation,and utilities insecurities and was given resources at the event. Pt also noted at the event that she did not have a PCP and was given PCP resources within [facility]. During the initial event f/u call, pt shared she had used her ob/gyn provider, Dr. Sallye Ober for primary care but wanted to review PCP options and still had her SDOH need resources. During the 60 day event f/u, health equity team member unable to contact pt by phone (VM left) and repeat PCP ([facility] flyer and [facility] clinic flyer) mailed as well as SDOH need resources and [place] 211 contact info in case needed. During the 6 month f/u attempt, health equity team unable to contact pt by phone and VM left. Current chart review indicates pt's last ov with her OB/Gyn whom pt stated in August, 2024, served as her PCP, Dr. Sallye Ober, was 10/31/22 but only other healthcare encounter was an urgent care visit on 03/15/23. Since unable to reach pt by phone and chart review does not reveal any new [facility]-visible PCP encounters, pt sent final letter with PCP info ([facility] and [facility]) in case pt still needs to establish care with PCP. Pt also sent another [place] [address] contact info card in case she is continuing to need food, transportation, and/or utility support. No additional health equity team support scheduled at this time.

## 2023-08-27 DIAGNOSIS — S39012A Strain of muscle, fascia and tendon of lower back, initial encounter: Secondary | ICD-10-CM | POA: Diagnosis not present
